# Patient Record
Sex: Male | Born: 1960 | Race: White | Marital: Married
Health system: Southern US, Community
[De-identification: ages and names within clinical notes are randomized; demographics above are authoritative.]

## PROBLEM LIST (undated history)

## (undated) DIAGNOSIS — I341 Nonrheumatic mitral (valve) prolapse: Secondary | ICD-10-CM

## (undated) DIAGNOSIS — M109 Gout, unspecified: Secondary | ICD-10-CM

## (undated) DIAGNOSIS — M1990 Unspecified osteoarthritis, unspecified site: Secondary | ICD-10-CM

## (undated) DIAGNOSIS — I499 Cardiac arrhythmia, unspecified: Secondary | ICD-10-CM

## (undated) DIAGNOSIS — M542 Cervicalgia: Secondary | ICD-10-CM

## (undated) DIAGNOSIS — I10 Essential (primary) hypertension: Secondary | ICD-10-CM

## (undated) HISTORY — DX: Nonrheumatic mitral (valve) prolapse: I34.1

## (undated) HISTORY — DX: Essential (primary) hypertension: I10

## (undated) HISTORY — PX: HX BACK SURGERY: SHX140

## (undated) HISTORY — DX: Gout, unspecified: M10.9

## (undated) HISTORY — DX: Unspecified osteoarthritis, unspecified site: M19.90

## (undated) HISTORY — DX: Cervicalgia: M54.2

---

## 1999-11-10 HISTORY — PX: HX BACK SURGERY: SHX140

## 2018-09-21 ENCOUNTER — Other Ambulatory Visit: Payer: Self-pay | Admitting: Orthopaedic Surgery

## 2018-09-21 DIAGNOSIS — M545 Low back pain, unspecified: Secondary | ICD-10-CM

## 2018-09-22 ENCOUNTER — Other Ambulatory Visit: Payer: Self-pay | Admitting: Orthopaedic Surgery

## 2018-09-22 DIAGNOSIS — Z77018 Contact with and (suspected) exposure to other hazardous metals: Secondary | ICD-10-CM

## 2018-09-26 ENCOUNTER — Ambulatory Visit
Admission: RE | Admit: 2018-09-26 | Discharge: 2018-09-26 | Disposition: A | Discharge status: Home / Self Care | Payer: Medicare Other | Source: Ambulatory Visit | Attending: Orthopaedic Surgery | Admitting: Orthopaedic Surgery

## 2018-09-26 DIAGNOSIS — Z77018 Contact with and (suspected) exposure to other hazardous metals: Secondary | ICD-10-CM

## 2018-10-10 ENCOUNTER — Ambulatory Visit
Admission: RE | Admit: 2018-10-10 | Discharge: 2018-10-10 | Disposition: A | Discharge status: Home / Self Care | Payer: Medicare Other | Source: Ambulatory Visit | Attending: Orthopaedic Surgery | Admitting: Orthopaedic Surgery

## 2018-10-10 DIAGNOSIS — M545 Low back pain, unspecified: Secondary | ICD-10-CM

## 2020-02-05 ENCOUNTER — Ambulatory Visit: Payer: Medicare Other | Attending: Internal Medicine

## 2020-02-05 DIAGNOSIS — Z23 Encounter for immunization: Secondary | ICD-10-CM

## 2020-02-05 NOTE — Progress Notes (Signed)
   Covid-19 Vaccination Clinic  Name:  Jeffrey Benjamin    MRN: [id number] DOB: 02/05/1961  02/05/2020  Mr. [name] was observed post Covid-19 immunization for 15 minutes without incident. He was provided with Vaccine Information Sheet and instruction to access the V-Safe system.   Mr. [name] was instructed to call 911 with any severe reactions post vaccine: Marland Kitchen Difficulty breathing  . Swelling of face and throat  . A fast heartbeat  . A bad rash all over body  . Dizziness and weakness   Immunizations Administered    Name Date Dose VIS Date Route   Pfizer COVID-19 Vaccine 02/05/2020  9:41 AM 0.3 mL 10/20/2019 Intramuscular   Manufacturer: [employer], [employer]   Lot: [id number]   NDC: [address]-9

## 2020-02-28 ENCOUNTER — Ambulatory Visit: Payer: Medicare Other | Attending: Internal Medicine

## 2020-02-28 DIAGNOSIS — Z23 Encounter for immunization: Secondary | ICD-10-CM

## 2020-02-28 NOTE — Progress Notes (Signed)
   Covid-19 Vaccination Clinic  Name:  Jeffrey Benjamin    MRN: [id number] DOB: 20-Mar-1961  02/28/2020  Jeffrey Benjamin was observed post Covid-19 immunization for 15 minutes without incident. He was provided with Vaccine Information Sheet and instruction to access the V-Safe system.   Jeffrey Benjamin was instructed to call 911 with any severe reactions post vaccine: Marland Kitchen Difficulty breathing  . Swelling of face and throat  . A fast heartbeat  . A bad rash all over body  . Dizziness and weakness   Immunizations Administered    Name Date Dose VIS Date Route   Pfizer COVID-19 Vaccine 02/28/2020  9:15 AM 0.3 mL 01/03/2019 Intramuscular   Manufacturer: [employer], [employer]   Lot: [id number]   NDC: [address]-9

## 2020-09-22 IMAGING — MR MR LUMBAR SPINE W/O CM
4 of 5 series · 25 of 48 positions shown · non-contrast
Comparison: None.

CLINICAL DATA: Low back pain

EXAM:
MRI LUMBAR SPINE WITHOUT CONTRAST
TECHNIQUE: Multiplanar, multisequence MR imaging of the lumbar spine was
performed. No intravenous contrast was administered.

[Series 5: T2 · sagittal · 4.0mm · 0.55mm/px · 6 of 15 slices shown (1 of 2)]
[im 1/15]
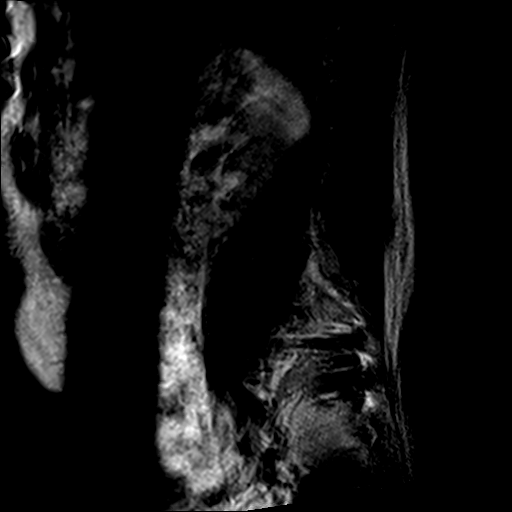
[im 3/15]
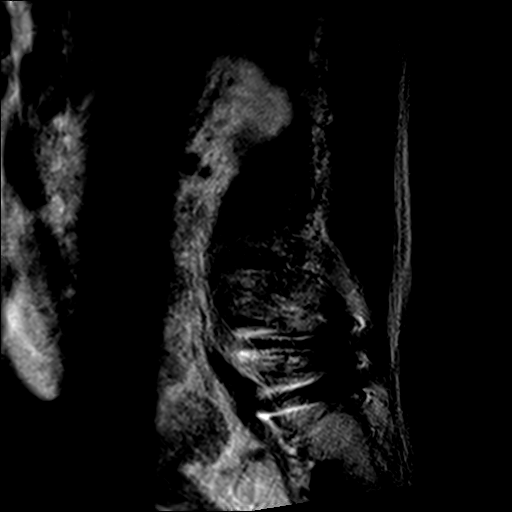
[im 6/15]
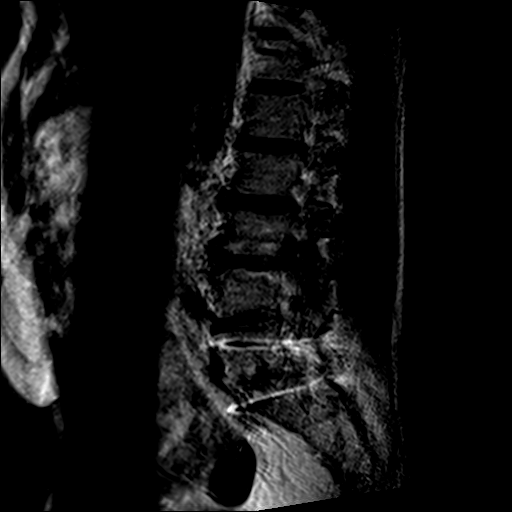
[im 9/15]
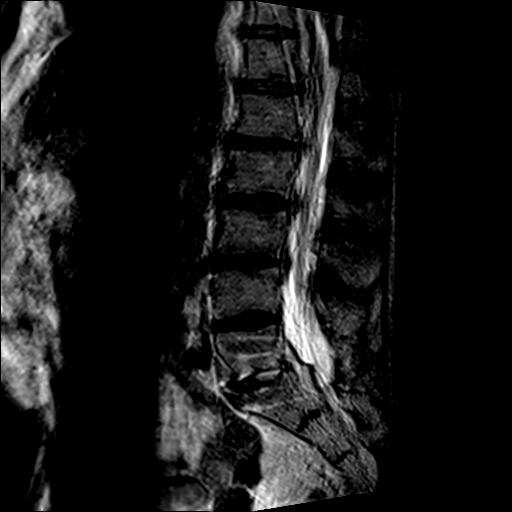
[im 12/15]
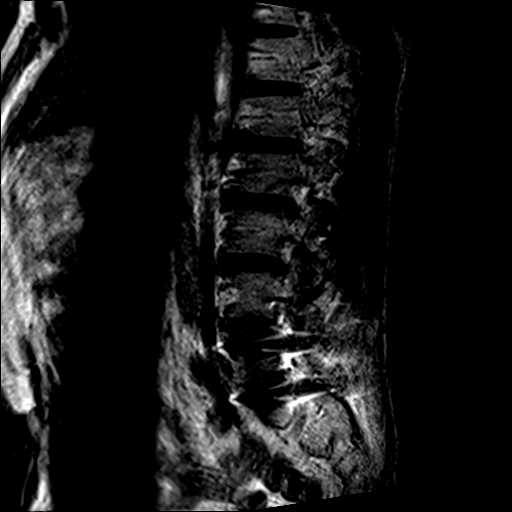
[im 15/15]
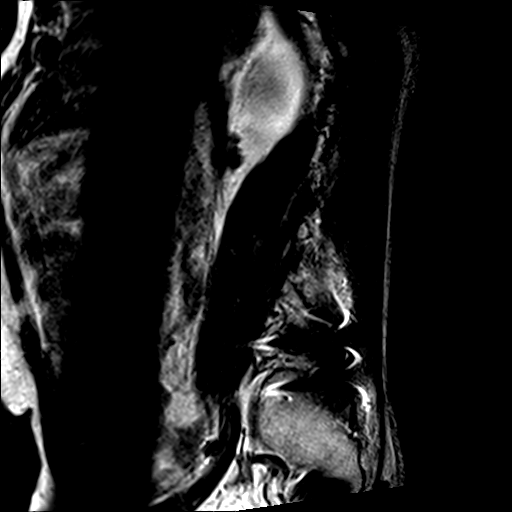

[Series 6: T1 · sagittal · 4.0mm · 0.55mm/px · 6 of 15 slices shown (1 of 2)]
[im 1/15]
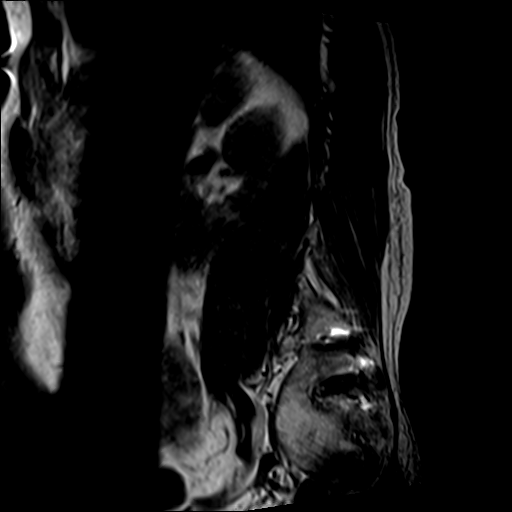
[im 3/15]
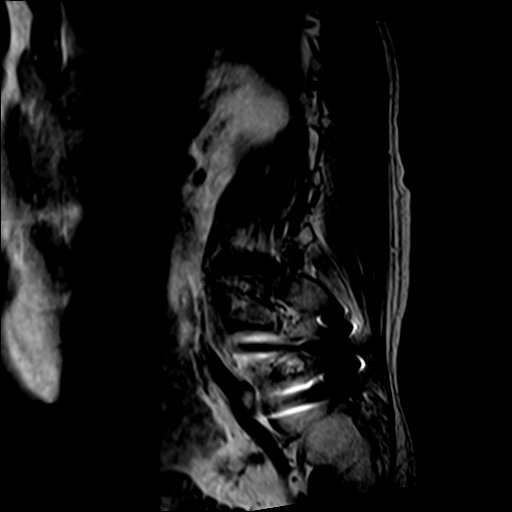
[im 6/15]
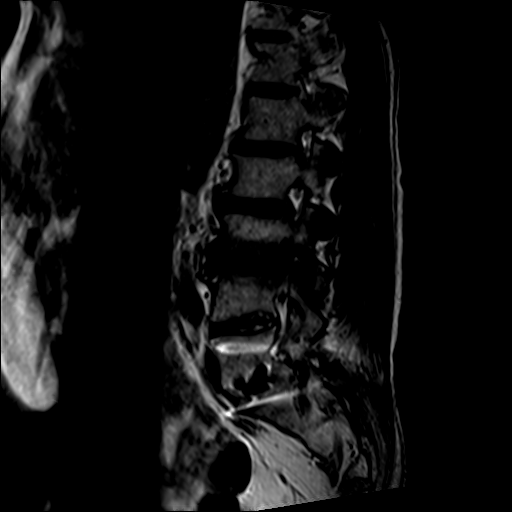
[im 9/15]
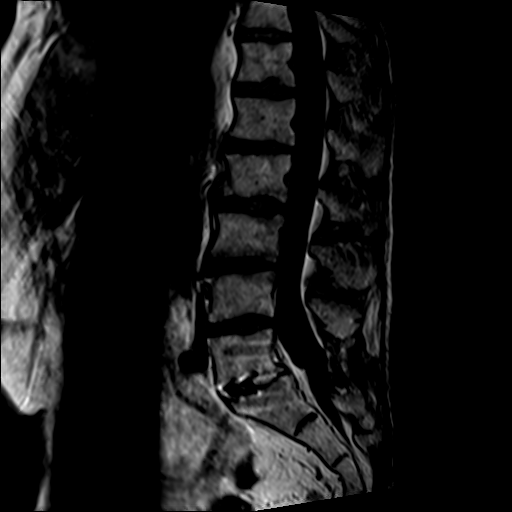
[im 12/15]
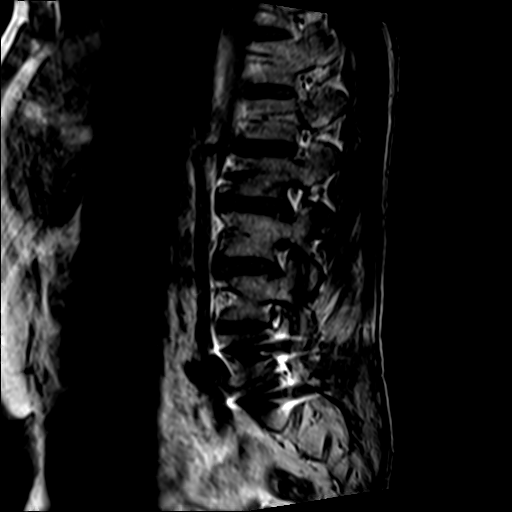
[im 15/15]
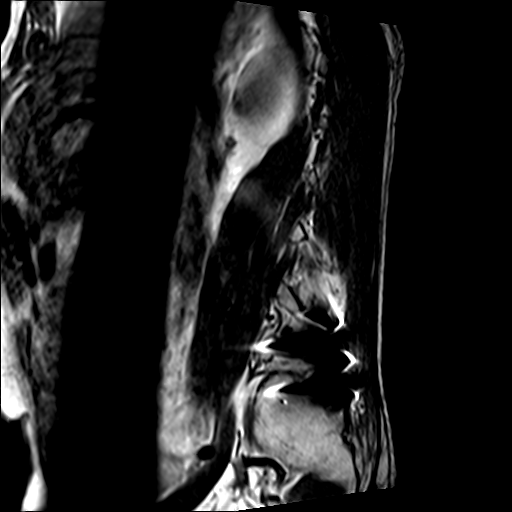

[Series 8: T1 · axial · 4.0mm · 0.39mm/px · z∈[-88,+90]mm · 4 of 38 slices shown (2 of 2)]
[im 1/38]
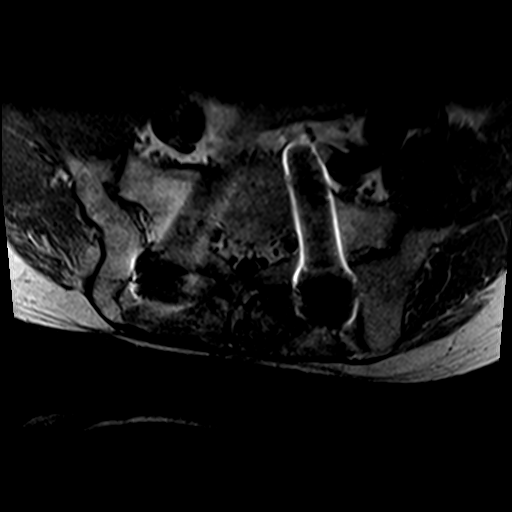
[im 6/38]
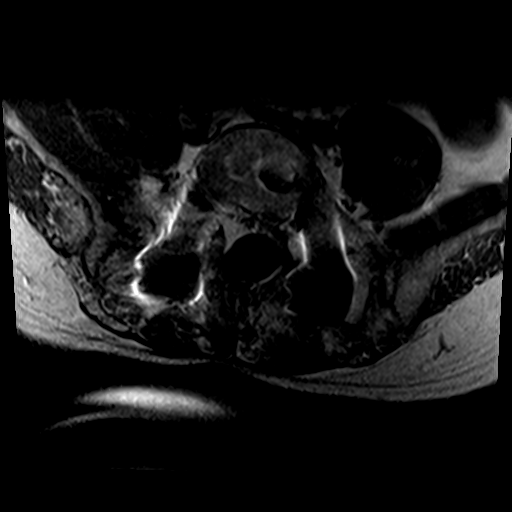
[im 19/38]
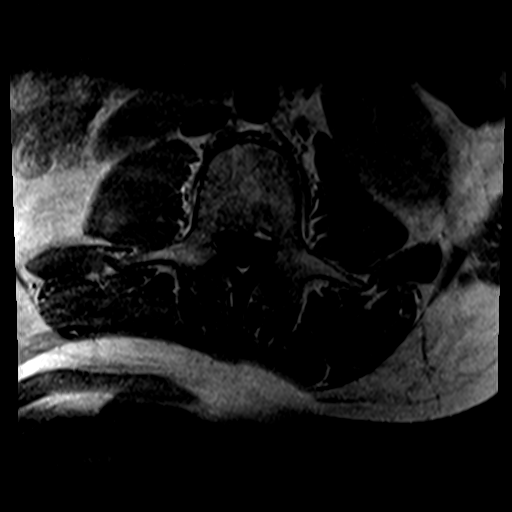
[im 32/38]
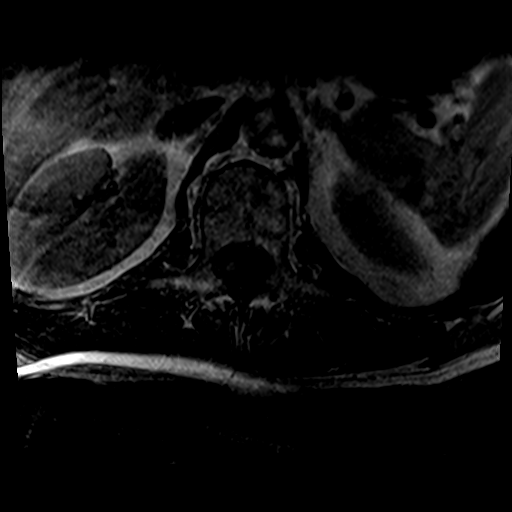

[Series 9: T2 · axial · 4.0mm · 0.78mm/px · z∈[-88,+120]mm · 9 of 38 slices shown (2 of 2)]
[im 1/38]
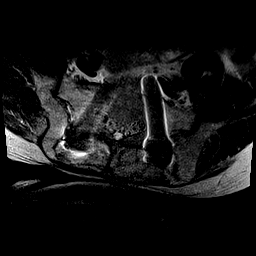
[im 6/38]
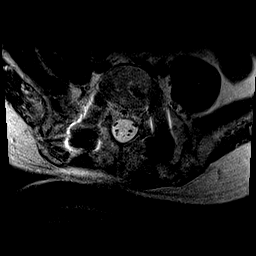
[im 11/38]
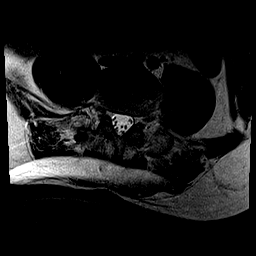
[im 16/38]
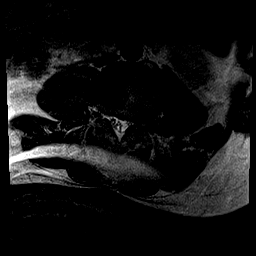
[im 19/38]
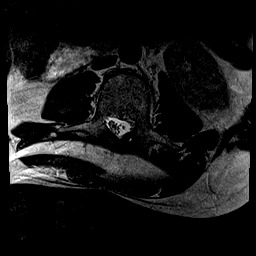
[im 22/38]
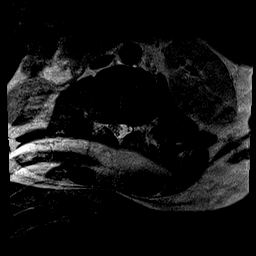
[im 27/38]
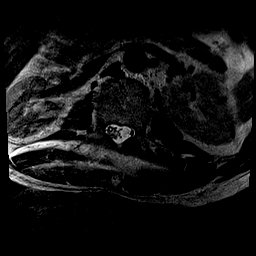
[im 32/38]
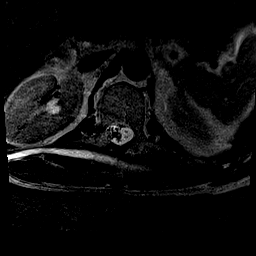
[im 38/38]
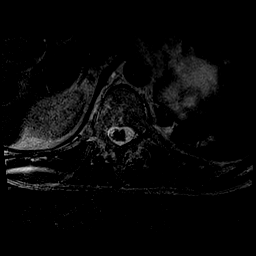

[25 of 48 positions shown; findings below may reference images not displayed]

FINDINGS: Segmentation:  Presumably standard based on the available coverage

Alignment:  Fused anterolisthesis at L5-S1 that is mild.

Vertebrae: No fracture, evidence of discitis, or bone lesion. There
is degenerative type endplate edematous signal about the L3-4 disc
space towards the left.

Conus medullaris and cauda equina: Conus extends to the L1 level.
The conus has a normal appearance. Cauda equina is preferentially
accumulated in the right canal, likely positional.

Paraspinal and other soft tissues: Negative

Disc levels:

T12- L1: Unremarkable.

L1-L2: Narrowing of the disc mild bulge.  No impingement

L2-L3: Narrowing of the disc with bulging.  No impingement

L3-L4: Disc narrowing asymmetric to the left where there is a
foraminal extrusion severely compressing the L3 nerve root. The
canal and right foramina are patent

L4-L5: Question posterior-lateral fusion/arthrodesis. The disc is
narrow. No impingement

L5-S1:PLIF with solid arthrodesis and no impingement
IMPRESSION: 1. L3-4 left foraminal extrusion with L3 compression.
2. L5-S1 PLIF with solid arthrodesis and no residual impingement.
There is likely also posterior arthrodesis or ankylosis at L4-5.

## 2021-11-20 ENCOUNTER — Other Ambulatory Visit (HOSPITAL_COMMUNITY): Payer: Self-pay | Admitting: INTERNAL MEDICINE

## 2021-11-20 DIAGNOSIS — R011 Cardiac murmur, unspecified: Secondary | ICD-10-CM

## 2021-12-09 ENCOUNTER — Ambulatory Visit (HOSPITAL_BASED_OUTPATIENT_CLINIC_OR_DEPARTMENT_OTHER): Payer: Medicare Other | Admitting: Cardiovascular Disease

## 2021-12-26 ENCOUNTER — Encounter (HOSPITAL_BASED_OUTPATIENT_CLINIC_OR_DEPARTMENT_OTHER): Payer: Self-pay

## 2022-01-01 ENCOUNTER — Ambulatory Visit (HOSPITAL_BASED_OUTPATIENT_CLINIC_OR_DEPARTMENT_OTHER): Payer: Medicare Other | Admitting: Cardiovascular Disease

## 2022-01-21 ENCOUNTER — Ambulatory Visit (INDEPENDENT_AMBULATORY_CARE_PROVIDER_SITE_OTHER): Payer: Medicare Other | Admitting: Interventional Cardiology

## 2022-01-21 ENCOUNTER — Encounter (HOSPITAL_BASED_OUTPATIENT_CLINIC_OR_DEPARTMENT_OTHER): Payer: Self-pay | Admitting: Interventional Cardiology

## 2022-01-21 ENCOUNTER — Other Ambulatory Visit: Payer: Self-pay

## 2022-01-21 VITALS — BP 160/100 | HR 67 | Ht 65.0 in | Wt 135.2 lb

## 2022-01-21 DIAGNOSIS — R011 Cardiac murmur, unspecified: Secondary | ICD-10-CM

## 2022-01-21 DIAGNOSIS — E785 Hyperlipidemia, unspecified: Secondary | ICD-10-CM

## 2022-01-21 DIAGNOSIS — I341 Nonrheumatic mitral (valve) prolapse: Secondary | ICD-10-CM

## 2022-01-21 DIAGNOSIS — I10 Essential (primary) hypertension: Secondary | ICD-10-CM

## 2022-01-21 NOTE — H&P (Signed)
Cardiology Consult Note  [facility]  [address]  [place] [address]  [phone]      Name: Caleb Knight                       Date of Birth: 05-31-1961   MRN:  [id number]                         Date of visit: 01/21/2022     PCP: Alen Blew, MD     Reason for Consult:     HOPI:        Caleb Knight is a  61 y.o. year old male who  has a history of valvular heart disease with mitral valve prolapse and used to follow up with a cardiologist at [facility] [place], [place].  He recently saw his PCP and requested establishing with a local cardiologist and hence was referred to [place].     he had an echo in 2021 which showed normal left ventricular systolic function, no wall motion abnormalities, borderline mitral valve prolapse with trace MR, borderline LVH.  He had a negative Lexiscan Cardiolite stress test in 2018 for unspecified       His cardiovascular risk factors are significant for hypertension, hyperlipidemia.   No previous history of smoking diabetes or family history premature CAD.      Most recent lab work showed normal hemoglobin, normal  Potassium, LDL 170, HDL 50, normal TSH.   He was offered statin therapy which he declined and would like to use lifestyle changes.      Today, heart rate  Is well controlled, blood pressure is mildly elevated.  He denies any chest pain nausea vomiting, dizziness, shortness of breath, edema or palpitations.    Past Medical History:   Diagnosis Date   . Hypertension            Past Surgical History:   Procedure Laterality Date   . HX BACK SURGERY             Current Outpatient Medications   Medication Sig   . amitriptyline (ELAVIL) 50 mg Oral Tablet Take 1 Tablet (50 mg total) by mouth Every night   . atenoloL (TENORMIN) 25 mg Oral Tablet    . meloxicam (MOBIC) 15 mg Oral Tablet    . traZODone (DESYREL) 100 mg Oral Tablet Take 1 Tablet (100 mg total) by mouth Every night       Allergies   Allergen Reactions   . Latex,  Freight forwarder        Family Medical History:     Problem Relation (Age of Onset)    COPD Brother    Cancer Brother    Diabetes Brother, Daughter    Elevated Lipids Daughter    Hearing Loss Mother    Heart Attack Father    Heart Disease Mother, Father, Sister    Hypertension (High Blood Pressure) Mother, Father            Social History     Tobacco Use   . Smoking status: Never   . Smokeless tobacco: Former     Types: Chew   Substance Use Topics   . Alcohol use: Not on file        REVIEW OF SYSTEMS:  Constitutional: No fevers, chills, malaise or abnormal weight loss.   HEENT: No issues  Respiratory: No cough, shortness of breath  Cardiovascular:  No chest painpalpitations, lower extremity edema.    Gastrointestinal: No abdominal pain, nausea, vomiting  Musculoskeletal: No weakness   Hematological: No issues  Genitourinary: No complaints  Skin: No rashes.  Neurological: Noweakness or loss of sensation  All other systems reviewed and are negative except as noted in HOPI.    PHYSICAL EXAMINATION:     Vitals reviewed. There were no vitals taken for this visit.      Vitals Filed    No data found in the last 1 encounters.         Constitutional: He is oriented to person, place, and time.  He appears well-developed and well-nourished.   HEENT:  Normocephalic, atraumatic.    Neck: Supple. Thyroid normal without enlargement.  Trachea midline.  Cardiovascular: Normal rate and rhythm without murmurs, rubs or gallops.  No JVD.  No bruits.  No peripheral edema is noted.  Pulmonary/Chest: Lungs clear without wheezes, rales or rhonchi.    Abdominal: Soft. Nontender, nondistended ,no organomegaly.  Musculoskeletal: No weakness.  Neurological: Alert and oriented x 4.  Cranial nerves 2-12 intact without defecits.  BUE's/BLE's.  Strength and sensation intact in all extremeties.   Skin: Skin is warm and dry.   Psychiatric:  Normal mood and affect. Speech is normal and behavior is normal. Judgment and thought content normal.   Vascular:  Peripheral  pulses well felt.  Labs:     No results found for this or any previous visit (from the past 24 hour(s)).    Labs:   No results found for: HA1C, MICALBCREAT, LDLCHOL, TRIG, HDLCHOL, CHOLESTEROL No results found for: SODIUM, POTASSIUM, CHLORIDE, CO2, BUN, CREATININE, GFR, CALCIUM      Imaging:    Assessment:    1. Murmur, cardiac    2. Benign hypertension    3. Hyperlipidemia LDL goal <70    4. Cardiac murmur    5. Mitral valve prolapse         Plan:  Patient is stable from a cardiovascular standpoint.  No symptoms of angina, no symptoms or signs of CHF  Blood pressure  is elevatedand heart rate is well controlled.   recommended to keep a log of blood pressure for a week and call me and if it is consistently greater than 130/80, his medication needs to be adjusted.   I reviewed his lab work with him especially is LDL of 170.   He would like to managed with lifestyle changes.  Encouraged healthy diet and daily exercise.  Recommended to be treated with medications,  Reassess in 3 months   Follow-up in 1 year or sooner if needed    No orders of the defined types were placed in this encounter.        Thank you very much for involving me in the care of this patient.  Please feel free to call me if I can be of any further assistance.    Electronically signed by     Aldona Bar, MD  01/21/2022, 10:14      This note may have been partially generated using MModal Fluency Direct system, and there may be some incorrect words, spellings, and punctuation that were not noted in checking the note before saving, though effort was made to avoid such errors.

## 2022-10-28 ENCOUNTER — Ambulatory Visit: Payer: Medicare Other

## 2022-10-30 ENCOUNTER — Telehealth (INDEPENDENT_AMBULATORY_CARE_PROVIDER_SITE_OTHER): Payer: Self-pay | Admitting: ORTHOPEDIC SURGERY

## 2022-10-30 NOTE — Telephone Encounter (Signed)
This documentation is for an intake review.    Documentation presented in the intake form suggested the patient has neck pain as well as bilateral shoulder pain in low back pain.  Other portions of the forearm indicate the patient has bilateral shoulder pain.  There is also a note of bilateral hand numbness in left lower extremity weakness, which has been present for 6 months.  The patient describes some difficulty with buttoning clothes and holding onto utensils.  He did have an epidural steroid injection performed in [place] in the lumbar region, which lasted 3-5 weeks.  This formed in January of 2021.  He did take a Medrol Dosepak in January of 2020 with no improvement in his symptoms.  He also has a history of previous lumbar fusion.  Based on the symptoms, which seem to be myelopathic, I would recommend an MRI series of the cervical spine.  Once we have this, I can re-review its to determine if he would be a reasonable candidate to meet with    Cervical x-ray, 10/28/2022.  There is evidence of multi level spondylotic changes with preservation of cervical lordosis.  There is no evidence of any anterolisthesis, but there is very slight retrolisthesis of C4-5.  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  There was no advanced imaging to review.

## 2022-11-12 ENCOUNTER — Other Ambulatory Visit (HOSPITAL_COMMUNITY): Payer: Self-pay | Admitting: Family

## 2022-11-12 DIAGNOSIS — M542 Cervicalgia: Secondary | ICD-10-CM

## 2022-11-12 DIAGNOSIS — G9009 Other idiopathic peripheral autonomic neuropathy: Secondary | ICD-10-CM

## 2022-11-19 ENCOUNTER — Other Ambulatory Visit: Payer: Self-pay

## 2022-11-19 ENCOUNTER — Inpatient Hospital Stay
Admission: RE | Admit: 2022-11-19 | Discharge: 2022-11-19 | Disposition: A | Discharge status: Home / Self Care | Payer: Medicare Other | Source: Ambulatory Visit | Attending: Family | Admitting: Family

## 2022-11-19 DIAGNOSIS — G9009 Other idiopathic peripheral autonomic neuropathy: Secondary | ICD-10-CM | POA: Insufficient documentation

## 2022-11-19 DIAGNOSIS — M542 Cervicalgia: Secondary | ICD-10-CM | POA: Insufficient documentation

## 2022-12-04 ENCOUNTER — Other Ambulatory Visit: Payer: Self-pay

## 2022-12-04 ENCOUNTER — Encounter (INDEPENDENT_AMBULATORY_CARE_PROVIDER_SITE_OTHER): Payer: Self-pay | Admitting: ORTHOPEDIC SURGERY

## 2022-12-04 ENCOUNTER — Other Ambulatory Visit (INDEPENDENT_AMBULATORY_CARE_PROVIDER_SITE_OTHER): Payer: Medicare Other

## 2022-12-04 ENCOUNTER — Ambulatory Visit: Payer: Medicare Other | Attending: ORTHOPEDIC SURGERY | Admitting: ORTHOPEDIC SURGERY

## 2022-12-04 VITALS — BP 130/72 | HR 64 | Temp 98.8°F | Ht 65.0 in | Wt 136.7 lb

## 2022-12-04 DIAGNOSIS — M542 Cervicalgia: Secondary | ICD-10-CM

## 2022-12-04 DIAGNOSIS — G959 Disease of spinal cord, unspecified: Secondary | ICD-10-CM | POA: Insufficient documentation

## 2022-12-04 NOTE — Progress Notes (Signed)
[facility]  DEPARTMENT OF ORTHOPAEDICS    History and Physical    Patient: Caleb Knight  DOB: 1961/10/15  MRN: [id number]    Date: 12/04/2022    CHIEF COMPLAINT:  Chief Complaint   Patient presents with    Neck Pain    Shoulder Pain        HISTORY OF PRESENT ILLNESS:  Finian Helvey is a 62 y.o. male who presents today for evaluation of neck pain, gait imbalance, bilateral hand numbness/tingling.  He has been dealing with this for several years now.  However, over the past year it has gotten much worse.  He states that often times he does not know where his left leg is in space.  He also has noticed significant weakness in his grip strength to the point where he has to compensate with this proximal upper extremity musculature.  He also complains of persistent bilateral hand numbness/tingling.  He also has significant neck pain that limits his ability to get good rest.  He has a remote history of a lower lumbar fracture that required surgery.  This happened in 2001 and he has been on disability since.  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  He has not done physical therapy nor received injections.  He does take gabapentin and provides him with some relief.  He states that over this last year all of the aforementioned symptoms have worsened over time and he presents today try to find a solution to these problems.  He notices difficulty buttoning buttons on his shirt, picking up coins from his wallet, and his handwriting has gotten worse.  He states he has some left lower extremity weakness that was the result from his injury back in 2001.      Past Medical History:   Diagnosis Date    Hypertension        Past Surgical History:   Procedure Laterality Date    HX BACK SURGERY       Family Medical History:       Problem Relation (Age of Onset)    COPD Brother    Cancer Brother    Diabetes Brother, Daughter    Elevated Lipids Daughter    Hearing Loss  Mother    Heart Attack Father    Heart Disease Mother, Father, Sister    Hypertension (High Blood Pressure) Mother, Father              Current Outpatient Medications:     amitriptyline (ELAVIL) 50 mg Oral Tablet, Take 1 Tablet (50 mg total) by mouth Every night, Disp: , Rfl:     atenoloL (TENORMIN) 25 mg Oral Tablet, , Disp: , Rfl:     gabapentin (NEURONTIN) 100 mg Oral Capsule, Take 1 Capsule (100 mg total) by mouth Three times a day, Disp: , Rfl:     meloxicam (MOBIC) 15 mg Oral Tablet, , Disp: , Rfl:     traZODone (DESYREL) 100 mg Oral Tablet, Take 1 Tablet (100 mg total) by mouth Every night, Disp: , Rfl:      Allergies   Allergen Reactions    Latex, Natural Rubber         REVIEW OF SYSTEMS:  Review of systems negative except as stated in HPI.    PHYSICAL EXAM:  BP 130/72   Pulse 64   Temp 37.1 C (98.8 F)   Ht 1.651 m (5\' 5" )   Wt 62 kg (  136 lb 11 oz)   BMI 22.75 kg/m     Gen - the patient is alert oriented x3 in no apparent distress   Standing position - patient stands with head and shoulders well balanced above pelvis  Range of motion - near full in flexion/extension and rotation.  Tenderness to palpation - None  Strength - the patient has 5/5 strength in the bilateral upper and lower extremities.  Sensation - the patient has sensation intact to light touch in the C5-T1 and L2-S1 nerve distribution bilaterally but diminished in the C6 and C7 distributions bilaterally.  Reflexes - Biceps +2 , Triceps +2 , Brachioradialis +2 , Patellar +2 , Achilles +2  Gait - normal , Tandem Gait - normal  Provocative Tests - Inverted radial reflex - not present , Hoffman's - not present , Clonus - no beats , Spurling's - negative , Lehrmitte - negative    IMAGING:  I independently reviewed all imaging and my interpretation is as follows:    XR of the cervical spine were obtained today.  This includes AP and lateral which demonstrate multilevel spondylotic changes.  Significant degenerative changes throughout the  subaxial cervical spine worse at the C3-C4, C4-C5, C5-C6 levels.  He has maintenance of his overall cervical lordosis and there is evidence of a grade 1 C4 on C5 retrolisthesis.    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    ASSESSMENT/PLAN:  62 y.o. male with     ICD-10-CM    1. Neck pain  M54.2 XR CERVICAL SPINE AP AND LATERAL      2. Cervical myelopathy (CMS HCC)  G95.9          We discussed the patient's clinical and radiological findings in detail.  We discussed the findings of cervical myelopathy and its typical presentation along with its prognosis.  We made him aware that this is a progressive condition that will worsen over time eventually leading to permanent loss of function and disability.  We discussed would the different surgical options available for this including multilevel laminoplasty versus laminectomy and fusion.  We let him know that the laminoplasty has a quicker recovery however he does have a component of significant neck pain and that a laminectomy and fusion would likely help alleviate the neck pain more so than the laminoplasty would.  We would let him know that he should go home with his wife and think about all of the information that was obtained today and we would talk to him in 1-2 weeks to see what decision he has come to. All the patient's questions were answered today and they were in full agreement with the plan.      Lucas Mallow, MD   Spine Fellow    I discussed the exam and findings with the fellow. I independently met with and examined the patient. Agree with plan as documented.     Mariella Saa, MD

## 2022-12-06 DIAGNOSIS — M4312 Spondylolisthesis, cervical region: Secondary | ICD-10-CM

## 2022-12-06 DIAGNOSIS — M5032 Other cervical disc degeneration, mid-cervical region, unspecified level: Secondary | ICD-10-CM

## 2022-12-06 DIAGNOSIS — M5033 Other cervical disc degeneration, cervicothoracic region: Secondary | ICD-10-CM

## 2022-12-21 ENCOUNTER — Other Ambulatory Visit (HOSPITAL_BASED_OUTPATIENT_CLINIC_OR_DEPARTMENT_OTHER): Payer: Self-pay | Admitting: Interventional Cardiology

## 2022-12-21 MED ORDER — ATENOLOL 25 MG TABLET
25.0000 mg | ORAL_TABLET | Freq: Every day | ORAL | 3 refills | Status: DC
Start: 2022-12-21 — End: 2024-06-14

## 2022-12-31 ENCOUNTER — Encounter (INDEPENDENT_AMBULATORY_CARE_PROVIDER_SITE_OTHER): Payer: Self-pay | Admitting: ORTHOPEDIC SURGERY

## 2023-01-20 ENCOUNTER — Inpatient Hospital Stay (HOSPITAL_BASED_OUTPATIENT_CLINIC_OR_DEPARTMENT_OTHER)
Admission: RE | Admit: 2023-01-20 | Discharge: 2023-01-20 | Disposition: A | Discharge status: Home / Self Care | Payer: Medicare Other | Source: Ambulatory Visit

## 2023-01-20 ENCOUNTER — Encounter (HOSPITAL_BASED_OUTPATIENT_CLINIC_OR_DEPARTMENT_OTHER): Payer: Self-pay | Admitting: Internal Medicine

## 2023-01-20 ENCOUNTER — Ambulatory Visit: Payer: Medicare Other | Attending: Internal Medicine | Admitting: Internal Medicine

## 2023-01-20 ENCOUNTER — Other Ambulatory Visit: Payer: Self-pay

## 2023-01-20 ENCOUNTER — Other Ambulatory Visit (HOSPITAL_BASED_OUTPATIENT_CLINIC_OR_DEPARTMENT_OTHER): Payer: Medicare Other

## 2023-01-20 VITALS — BP 138/80 | HR 70 | Temp 96.8°F | Ht 64.96 in | Wt 132.3 lb

## 2023-01-20 DIAGNOSIS — Z01818 Encounter for other preprocedural examination: Secondary | ICD-10-CM

## 2023-01-20 DIAGNOSIS — M810 Age-related osteoporosis without current pathological fracture: Secondary | ICD-10-CM

## 2023-01-20 DIAGNOSIS — M79609 Pain in unspecified limb: Secondary | ICD-10-CM

## 2023-01-20 LAB — COMPREHENSIVE METABOLIC PANEL, NON-FASTING
ALBUMIN: 4.3 g/dL (ref 3.4–4.8)
ALKALINE PHOSPHATASE: 89 U/L (ref 45–115)
ALT (SGPT): 20 U/L (ref 10–55)
ANION GAP: 9 mmol/L (ref 4–13)
AST (SGOT): 42 U/L (ref 8–45)
BILIRUBIN TOTAL: 0.4 mg/dL (ref 0.3–1.3)
BUN/CREA RATIO: 23 — ABNORMAL HIGH (ref 6–22)
BUN: 18 mg/dL (ref 8–25)
CALCIUM: 10.5 mg/dL — ABNORMAL HIGH (ref 8.6–10.3)
CHLORIDE: 105 mmol/L (ref 96–111)
CO2 TOTAL: 25 mmol/L (ref 23–31)
CREATININE: 0.79 mg/dL (ref 0.75–1.35)
ESTIMATED GFR - MALE: >90 mL/min/BSA (ref >=60)
GLUCOSE: 132 mg/dL — ABNORMAL HIGH (ref 65–125)
POTASSIUM: 4.7 mmol/L (ref 3.5–5.1)
PROTEIN TOTAL: 7.4 g/dL (ref 6.0–8.0)
SODIUM: 139 mmol/L (ref 136–145)

## 2023-01-20 LAB — CBC WITH DIFF
BASOPHIL #: <0.1 10*3/uL (ref <=0.20)
BASOPHIL %: 0.7 %
EOSINOPHIL #: 0.12 10*3/uL (ref <=0.50)
EOSINOPHIL %: 1 %
HCT: 40.1 % (ref 38.9–52.0)
HGB: 13.1 g/dL — ABNORMAL LOW (ref 13.4–17.5)
IMMATURE GRANULOCYTE #: <0.1 10*3/uL (ref <0.10)
IMMATURE GRANULOCYTE %: 0.3 % (ref 0.0–1.0)
LYMPHOCYTE #: 1.83 10*3/uL (ref 1.00–4.80)
LYMPHOCYTE %: 15.8 %
MCH: 27.9 pg (ref 26.0–32.0)
MCHC: 32.7 g/dL (ref 31.0–35.5)
MCV: 85.5 fL (ref 78.0–100.0)
MONOCYTE #: 0.7 10*3/uL (ref 0.20–1.10)
MONOCYTE %: 6.1 %
MPV: 12.7 fL — ABNORMAL HIGH (ref 8.7–12.5)
NEUTROPHIL #: 8.8 10*3/uL — ABNORMAL HIGH (ref 1.50–7.70)
NEUTROPHIL %: 76.1 %
PLATELETS: 224 10*3/uL (ref 150–400)
RBC: 4.69 10*6/uL (ref 4.50–6.10)
RDW-CV: 13.7 % (ref 11.5–15.5)
WBC: 11.6 10*3/uL — ABNORMAL HIGH (ref 3.7–11.0)

## 2023-01-20 LAB — PT/INR
INR: 0.96 (ref 0.80–1.20)
PROTHROMBIN TIME: 11.7 seconds (ref 9.8–14.4)

## 2023-01-20 NOTE — H&P (Signed)
[facility]  Consult       Garris, Besch, 62 y.o. male Surgeon:  Alfonso Patten, MD   Date of Birth:  [birth year] Date of Service:  01/20/2023    Medical Record Number: [id number] Preferred pharmacy:       Problem List Items Addressed This Visit    None  Visit Diagnoses       Pain in extremity, unspecified extremity    -  Primary    Relevant Orders    PT/INR    Preop testing        Relevant Orders    CBC/DIFF    COMPREHENSIVE METABOLIC PANEL, NON-FASTING    PT/INR    ECG 12-LEAD (UTC ONLY)    VITAMIN D 25 TOTAL    Osteoporosis, unspecified osteoporosis type, unspecified pathological fracture presence        Relevant Orders    VITAMIN D 25 TOTAL           Neck pain  Cervical myelopathy  - Patient has failed conservative therapy, operative management is planned. C4-C7 decompression, C3-T1 fusion  - Pre-operative labs and EKG ordered, pending  - Patient is without risk factors for excessive bleeding or blood clots.  - Pain management as per surgical team. Continue conservative symptom relief until operative management.  -On gabapentin, elavil, meloxicam    History of Lspine surgery ([facility], 2001)  -records requested    Mitral valve prolapse  -Saw Cardiology 01/2022  -echo in 2021 which showed normal left ventricular systolic function, no wall motion abnormalities, borderline mitral valve prolapse with trace MR, borderline LVH. He had a negative Lexiscan Cardiolite stress test in 2018  -Continue atenolol 12.'5mg'$  daily    Insomnia  -Continue trazodone    Poor dentition  -has a broken tooth that needs removed    Takes fish oil for general health and arthritis.    Pre-operative examination  - Patient is at acceptable risk of perioperative complications for the planned moderate risk surgery pending lab results, EKG, and below clearances.   - Clearances pending: labs, EKG    Orders Placed This Encounter    CBC/DIFF    COMPREHENSIVE METABOLIC PANEL, NON-FASTING     PT/INR    VITAMIN D 25 TOTAL    ECG 12-LEAD (UTC ONLY)       HPI:  Caleb Knight is a 62 y.o. male that presents with need for pre-operative medical  optimization. He has a history of neck pain and cervical myelopathy requiring cervical decompression and fusion.     He says that he has been having neck pain and hand numbness and left leg numbness for quite some time worsening gradually but has been worse over last 2 years. He has difficulty sleeping at night.    Patient reports additional history of mitral valve prolapse.    The patient denies a history of MI, CVA, cancer, diabetes, pulmonary disorders like asthma or COPD, cardiac stents, heart arrhythmias (like a-fib), thyroid disease, GI problems, liver disease, renal problems (including CKD), anemia, or ever needing a blood transfusion.    Patient denies any fever, chills, dizziness, lightheadedness, chest pain, SOB, abdominal pain, nausea, vomiting, constipation, diarrhea, or trouble/pain with urination.    Patient denies chest pain, shortness of breath at rest, dyspnea on exertion, pre-syncope/syncope, palpitations, orthopnea, or PND  Patient denies previous DVT, family history of DVT, or sleep apnea.   Patient reports any ongoing dental issues. Needs a tooth pulled  Patient denies any previous problems with anesthesia.   Patient denies any family history of problems with anesthesia.    METS Metabolic Equivalents:  >4      History of MRSA?   No   Significant allergies to metal/nickel, latex, acrylic or iodine   Latex     A diagnosis of COVID-19 in the past 90 days?   No     Personal or family history of bleeding/clotting disorders:     No     History of a prosthetic joint infection/septic joint:      No     History of MI/CVA/cardiac stents/cardiac surgeries:      No     Pacemaker, ICD, or implanted device?     No     Last steroid injection in operative site:     NA         Social History     Tobacco Use    Smoking status: Never    Smokeless tobacco: Former      Types: Chew   Substance Use Topics    Alcohol use: Yes     Comment: occasional    Drug use: Not Currently       Current medications:  Outpatient Medications Marked as Taking for the 01/20/23 encounter (Office Visit) with Alver Sorrow, MD   Medication Sig    amitriptyline (ELAVIL) 50 mg Oral Tablet Take 1 Tablet (50 mg total) by mouth Every night    atenoloL (TENORMIN) 25 mg Oral Tablet Take 1 Tablet (25 mg total) by mouth Once a day (Patient taking differently: Take 0.5 Tablets (12.5 mg total) by mouth Once a day)    gabapentin (NEURONTIN) 100 mg Oral Capsule Take 1 Capsule (100 mg total) by mouth Three times a day    hydrOXYzine HCL (ATARAX) 25 mg Oral Tablet Take 1 Tablet (25 mg total) by mouth Twice per day as needed for Anxiety    meloxicam (MOBIC) 15 mg Oral Tablet Take 1 Tablet (15 mg total) by mouth Once a day    omega-3-DHA-EPA-fish oil (FISH OIL) 1,000 mg (120 mg-180 mg) Oral Capsule Take 1 Capsule by mouth Once a day    traZODone (DESYREL) 100 mg Oral Tablet Take 1 Tablet (100 mg total) by mouth Every night       Review of Systems   Constitutional:  Negative for activity change, appetite change, fatigue and unexpected weight change.   HENT:  Positive for dental problem. Negative for congestion, sinus pain, sore throat and trouble swallowing.    Eyes:  Negative for discharge and visual disturbance.   Respiratory:  Negative for cough, chest tightness, shortness of breath and wheezing.    Cardiovascular:  Negative for chest pain, palpitations and leg swelling.   Gastrointestinal:  Negative for abdominal pain, constipation, diarrhea, nausea and vomiting.   Genitourinary:  Negative for difficulty urinating, dysuria, frequency and urgency.   Musculoskeletal:  Positive for back pain. Negative for neck pain.   Skin:  Negative for rash and wound.   Neurological:  Positive for weakness and numbness. Negative for dizziness, syncope, light-headedness and headaches.   Hematological:  Does not bruise/bleed  easily.   Psychiatric/Behavioral:  Negative for behavioral problems.          Physical Exam  Vitals reviewed.   Constitutional:       General: He is not in acute distress.     Appearance: Normal appearance. He is not ill-appearing.   HENT:      Head:  Normocephalic and atraumatic.      Mouth/Throat:      Mouth: Mucous membranes are moist.      Comments: Broken upper left molar  Eyes:      Extraocular Movements: Extraocular movements intact.      Conjunctiva/sclera: Conjunctivae normal.   Neck:      Vascular: No carotid bruit.   Cardiovascular:      Rate and Rhythm: Normal rate and regular rhythm.      Pulses: Normal pulses.      Heart sounds: Normal heart sounds. No murmur heard.  Pulmonary:      Effort: Pulmonary effort is normal.      Breath sounds: Normal breath sounds. No wheezing or rhonchi.   Abdominal:      General: Bowel sounds are normal. There is no distension.      Palpations: Abdomen is soft.      Tenderness: There is no abdominal tenderness.   Musculoskeletal:         General: No swelling or tenderness.      Cervical back: Normal range of motion and neck supple.      Right lower leg: No edema.      Left lower leg: No edema.   Skin:     General: Skin is warm and dry.      Findings: No bruising or lesion.   Neurological:      General: No focal deficit present.      Mental Status: He is alert and oriented to person, place, and time.      Motor: No weakness.   Psychiatric:         Mood and Affect: Mood normal.         Behavior: Behavior normal.        Vitals:    01/20/23 1219   BP: 138/80   Pulse: 70   Temp: 36 C (96.8 F)   Weight: 60 kg (132 lb 4.4 oz)   Height: 1.65 m (5' 4.96")   BMI: 22.08           ASA 2 - Patient with mild systemic disease with no functional limitations  > 4 METs - Climbing a flight of stairs    [facility] Risk Evaluation  None  The risk of major cardiac complications varies according to the number of risk factors. If one includes only cardiac death, nonfatal MI, and nonfatal cardiac  arrest as major cardiac events in the [place], the following rates of adverse outcomes were seen:  No risk factors -- 0.4 percent (95% CI 0.1-0.8 percent)   One risk factor -- 1.0 percent (95% CI 0.5-1.4 percent)   Two risk factors -- 2.4 percent (95% CI 1.3-3.5 percent)   Three or more risk factors -- 5.4 percent (95% CI 2.8-7.9 percent)  *Perioperative cardiac events in patients undergoing noncardiac surgery: a review of the magnitude of the problem, the pathophysiology of the events and methods to estimate and communicate risk.Devereaux PJ, Merilyn Baba [place]. 2005;173(6):627.    Coding:   MDM  Reviewed: previous chart, nursing note and vitals  Reviewed previous: x-ray, ECG and labs  Interpretation: labs, ECG and x-ray  Consults: orthopedics       * I reviewed the documentation and progress notes from the above consults listed.     Labs reviewed:  A1C: Not Found  A1C Date: Not Found  COMPLETE BLOOD COUNT   No results found for: "WBC", "HGB", "HCT", "PLTCNT", "  BANDS"    DIFFERENTIAL  No results found for: "PMNS", "LYMPHOCYTES", "MYELOCYTES", "MONOCYTES", "EOSINOPHIL", "BASOPHILS", "NRBCS", "PMNABS", "LYMPHSABS", "EOSABS", "MONOSABS", "BASOSABS"  COMPREHENSIVE METABOLIC PANEL FASTING  No results found for: "SODIUM", "POTASSIUM", "CHLORIDE", "CO2", "ANIONGAP", "BUN", "CREATININE", "GLUCOSEFAST", "GLUCOSE", "CALCIUM", "PHOSPHORUS", "ALB", "ALBUMIN", "TOTALPROTEIN", "ALKPHOS", "AST", "ALT", "BILIRUBINCON"  PROTHROMBIN TIME and INR  No results found for: "PROTHROMTME", "INR"  VITAMIN D 25-HYDROXY  No results found for: "VITD25", "VITD"    XR Cspine 12/04/22  IMPRESSION:  Redemonstrated degenerative changes of the cervical spine, without acute bony abnormality.    Fransisco Hertz, MD, MPH  01/20/2023, 09:09  [facility]  Dept of Orthopaedics

## 2023-01-21 DIAGNOSIS — Z01818 Encounter for other preprocedural examination: Secondary | ICD-10-CM

## 2023-01-21 LAB — ECG 12-LEAD (UTC ONLY)
Atrial Rate: 53 {beats}/min
Calculated P Axis: 19 degrees
Calculated R Axis: 59 degrees
Calculated T Axis: 59 degrees
PR Interval: 156 ms
QRS Duration: 78 ms
QT Interval: 422 ms
QTC Calculation: 395 ms
Ventricular rate: 53 {beats}/min

## 2023-01-21 LAB — VITAMIN D 25 TOTAL: VITAMIN D, 25OH: 40 ng/mL (ref 30–100)

## 2023-01-25 ENCOUNTER — Encounter (HOSPITAL_COMMUNITY): Payer: Self-pay

## 2023-01-25 ENCOUNTER — Other Ambulatory Visit: Payer: Self-pay

## 2023-01-25 ENCOUNTER — Ambulatory Visit (INDEPENDENT_AMBULATORY_CARE_PROVIDER_SITE_OTHER): Payer: Self-pay | Admitting: Nurse Practitioner

## 2023-01-25 ENCOUNTER — Ambulatory Visit: Payer: Medicare Other | Admitting: Nurse Practitioner

## 2023-01-25 ENCOUNTER — Encounter (INDEPENDENT_AMBULATORY_CARE_PROVIDER_SITE_OTHER): Payer: Self-pay | Admitting: Nurse Practitioner

## 2023-01-25 VITALS — BP 158/81 | HR 55 | Ht 65.0 in | Wt 132.3 lb

## 2023-01-25 DIAGNOSIS — G959 Disease of spinal cord, unspecified: Secondary | ICD-10-CM | POA: Insufficient documentation

## 2023-01-25 DIAGNOSIS — Z01818 Encounter for other preprocedural examination: Secondary | ICD-10-CM

## 2023-01-25 NOTE — Progress Notes (Signed)
[place], [place]  [place] [address]  Operated by [facility]     Name: Caleb Knight MRN:  [id number]   Date: 01/25/2023 Age: 62 y.o.     HISTORY AND PHYSICAL     REQUESTED DOS: 02/08/23    PREOPERATIVE DIAGNOSIS: Cervical Myelopathy    NAME OF PROCEDURE: C3-t1 posterior fusion, C4-C7 decompression     HISTORY OF PRESENT ILLNESS  Caleb Knight is a 62 y.o. male who returns to the clinic today for his pre-operative history and physical examination.     The patient has been experiencing similar symptoms since last visit: neck pain, gait imbalance, bilateral hand numbness/tingling.  He has been dealing with this for several years now.  However, over the past year it has gotten much worse.  He states that often times he does not know where his left leg is in space.  He also has noticed significant weakness in his grip strength to the point where he has to compensate with this proximal upper extremity musculature.  He also complains of persistent bilateral hand numbness/tingling.  He also has significant neck pain that limits his ability to get good rest.  He has a remote history of a lower lumbar fracture that required surgery.  This happened in 2001 and he has been on disability since.  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  He has not done physical therapy nor received injections.  He does take gabapentin and provides him with some relief.  He states that over this last year all of the aforementioned symptoms have worsened over time and he presents today try to find a solution to these problems.  He notices difficulty buttoning buttons on his shirt, picking up coins from his wallet, and his handwriting has gotten worse.  He states he has some left lower extremity weakness that was the result from his injury back in 2001.       Today the pt would complain about more pain behind ears and into left leg.     After assessing the patient on 12/04/22 and  reviewing his imaging, Dr. Lollie Sails had an in depth surgical discussion regarding risks, recovery, and success rates of the above procudure and the patient wished to proceed.       PAST MEDICAL HISTORY  Denies  a history of DVT, PE, or bleeding disorder.   Denies unless bolded:  a history of diabetes, sleep apnea, myocardial infarction, stroke, or MRSA.    Denies a history of anesthesia complications.     Past Medical History:   Diagnosis Date    Hypertension     Mitral valve prolapse     Neck pain     Osteoarthritis           There is no problem list on file for this patient.       PAST SURGICAL HISTORY  Past Surgical History:   Procedure Laterality Date    HX BACK SURGERY      Lumbar        MEDICATIONS   Outpatient Medications Marked as Taking for the 01/25/23 encounter (Office Visit) with Gara Kroner, FNP-C   Medication Sig    amitriptyline (ELAVIL) 50 mg Oral Tablet Take 1 Tablet (50 mg total) by mouth Every night    atenoloL (TENORMIN) 25 mg Oral Tablet Take 1 Tablet (25 mg total) by mouth Once a day (Patient taking differently: Take 0.5 Tablets (12.5 mg  total) by mouth Once a day)    gabapentin (NEURONTIN) 100 mg Oral Capsule Take 1 Capsule (100 mg total) by mouth Three times a day    hydrOXYzine HCL (ATARAX) 25 mg Oral Tablet Take 1 Tablet (25 mg total) by mouth Twice per day as needed for Anxiety    meloxicam (MOBIC) 15 mg Oral Tablet Take 1 Tablet (15 mg total) by mouth Once a day    omega-3-DHA-EPA-fish oil (FISH OIL) 1,000 mg (120 mg-180 mg) Oral Capsule Take 1 Capsule by mouth Once a day    traZODone (DESYREL) 100 mg Oral Tablet Take 1 Tablet (100 mg total) by mouth Every night        ALLERGIES  Admits   an allergy to latex, or anesthesia. Is NOT allergic to antibiotics.    Allergies   Allergen Reactions    Latex, Natural Rubber         SOCIAL HISTORY   Social History     Substance and Sexual Activity   Alcohol Use Yes    Comment: occasional      Social History     Tobacco Use   Smoking Status Never    Smokeless Tobacco Former        FAMILY HISTORY   Family Medical History:       Problem Relation (Age of Onset)    COPD Brother    Cancer Brother    Diabetes Brother, Daughter    Elevated Lipids Daughter    Hearing Loss Mother    Heart Attack Father    Heart Disease Mother, Father, Sister    Hypertension (High Blood Pressure) Mother, Father               REVIEW OF SYSTEMS  General: Negative for fever, night sweats, fatigue, and chills.  Cardiovascular: Negative for chest pain and palpitations.   Respiratory: Negative for shortness of breath and cough.   HEENT: Negative for headache, changes in vision/hearing/smell, and sore throat.   Gastrointestinal: Negative for dysphagia, abdominal pain, N/V/D, and hematochezia.   Genitourinary: Negative for dysuria, polyuria, changes in urination, and hematuria.   Hematologic: Negative for nosebleeds and unexplained/easy bruising or bleeding.   Musculoskeletal: Positive for arthralgias.     All other systems reviewed and negative.    PHYSICAL EXAMINATION   BP (!) 158/81   Pulse 55   Ht 1.651 m (5\' 5" )   Wt 60 kg (132 lb 4.4 oz)   BMI 22.01 kg/m       General Assessment: Patient is alert and oriented x 3 and in no signs of distress.  Psychosocial: Pleasant. Normal affect.   Head: Normocephalic. Atraumatic.   EENT: Within normal limits.   Neck: Supple. Full ROM. No noted lymphadenopathy. Trachea midline.  Skin: Warm and well perfused.  Cardiovascular: RRR. NMRG. 2+ Peripheral pulses.   Respiratory: No retractions or cyanosis present. Normal respiratory effort. Clear to auscultation bilaterally.   Abdomen: Soft and non-tender. No guarding, masses, rebound, or organomegaly/hepatosplenomegaly. Bowel sounds are present and normoactive.   Musculoskeletal: Gait steady and balanced, somewhat widebased. Arthritic changes of bilat finger joints, limits ROM somewhat  Skin: Skin overlying the cervical spine is without lesions    Sensation:   Upper Ext. Lateral Arm Rad Forearm Index  Fing Little Finger Uln Forearm   Right 2/2 2/2 2/2 2/2 2/2   Left 2/2 2/2 2/2 2/2 2/2     Manual Muscle Grading:  Upper Ext. Deltoid Bicep Tricep Wrist Ext. Wrist Flex. Finger Flex.  Intrinsics   Right 5/5 5/5 5/5 5/5 5/5 5/5 4+/5   Left 5/5 5/5 5/5 5/5 5/5 5/5 4/5     Reflexes:  Upper Ext. Biceps BR Triceps   Right 2 2 2    Left 3 2 2          IMAGING  None today     MRI of the cervical spine done 11/19/22 demonstrate multilevel spondylotic changes, severe cervical stenosis worse at the C3-C4, C4-C5, C5-C6 levels with severe cord compression and cord deformation.       ASSESSMENT & PLAN   1. The patient is scheduled for surgery for a C3-T1 posterior fusion with C4-c7 decomp to be performed on 02/08/23.      2. The risks and benefits of this surgical procedure were discussed at length with patient. The risks include but are not limited to blood loss, infection, nerve damage, paralysis, cosmetic deformity, need for additional surgery, persistent pain, dural leak, non-union, fixation/hardware failure, fracture, visceral/vascular injury, adjacent segment problems, failure to relieve symptoms, medical problems and/or anesthesia problems such as heart attack, stroke, DVT, PE, kidney failure, pneumonia, sepsis, visual problems/blindness, and even death. We explained that we are obligated to explain the risks and alternative treatments so that the patient can make an informed decision.  Surgical Consent was obtained and the Consent Form was signed and witnessed.  The patient consents to transfusion of blood or blood products as well as to being photographed or filmed during the course of treatment or operation. All questions were answered to the best of my ability. The patient voiced understanding and provided informed consent which was witnessed appropriately.  Dr. Lollie Sails signed the consent today.     3. Medical records were reviewed at today's visit and the patient saw pre-admission testing and will obtain any outstanding labs  and/or imaging studies. Pre-operative orders were placed.    The pt was given hibiclens and advised to wash with this PM before and AM of Sx.    4. The patient was instructed to stop 1 week prior to surgery any OTC Supplements, ASA or other NSAIDs, and Plavix. Stop Coumadin 5 days prior to surgery, any ACE or ARB 24 hours prior to surgery. If taking Metformin, hold AM of surgery. GLP-1 Agonists should be held for one half-life before surgery: Exenatide [Byetta] should be held on the day of surgery, weekly Injectible drugs (Example: Semaglutide [Ozempic], Dulaglutide [Trulicity]) should be held for ONE WEEK.       If a patient has not held their GLP-1 receptor agonist, then that patient should be considered a "full stomach" NO MATTER THE FASTING TIME.      5. The patient denies history of substance abuse treatment.  I have reviewed prior medication history in the medical record for this patient. I have also reviewed information contained in the state controlled prescription drug monitoring database (PDMP/NarxCheck). The surgery for which opioid prescription is indicated is a surgery. The post-operative opioid, dosage, frequency, and dispense # are stated above. The duration of opioid prescriptions by Dr. Lollie Sails  is 6 weeks, starting on day of surgery. Narcotic consent and contract WAS reviewed and signed in clinic today. I discussed with the patient the [facility] opioid policy.  The patient understands that they will be provided with a prescription of a limited quantity of pain medication postoperatively. They are also aware that we will wean the narcotic medications postoperatively to minimize the risk of physical dependency (all of our surgical patients are to be  off all narcotics by 4-6 weeks postop), but we will do so taking into consideration the extent of surgery done and the patient's pain response postoperatively.     Post-op visits scheduled for 2 weeks post-op.    The patient was instructed to call with  any concerns prior to surgery.  All the patient's questions were answered at this time and he understood and agreed to this plan.     Day of surgery orders were placed. Proceed with surgery as scheduled.     On the day of the encounter, a total of  60 minutes was spent on this patient encounter including review of historical information, examination, documentation and post-visit activities.     Gara Kroner, NP  Department of Orthopaedics-Spine              CC:  Dinah Beers, MD  [address] [id number]   [place], [place], [place]  [address]  [place],  [place] [address]

## 2023-01-26 ENCOUNTER — Inpatient Hospital Stay (HOSPITAL_COMMUNITY)
Admission: RE | Admit: 2023-01-26 | Discharge: 2023-01-26 | Disposition: A | Discharge status: Home / Self Care | Payer: Medicare Other | Source: Ambulatory Visit

## 2023-01-26 ENCOUNTER — Encounter (HOSPITAL_COMMUNITY): Payer: Self-pay

## 2023-01-26 HISTORY — DX: Cardiac arrhythmia, unspecified: I49.9

## 2023-01-26 HISTORY — DX: Essential (primary) hypertension: I10

## 2023-01-28 ENCOUNTER — Telehealth: Payer: Medicare Other | Attending: ORTHOPEDIC SURGERY | Admitting: Family

## 2023-01-28 ENCOUNTER — Encounter (HOSPITAL_BASED_OUTPATIENT_CLINIC_OR_DEPARTMENT_OTHER): Payer: Self-pay | Admitting: Family

## 2023-01-28 DIAGNOSIS — Z01818 Encounter for other preprocedural examination: Secondary | ICD-10-CM

## 2023-01-28 DIAGNOSIS — I4891 Unspecified atrial fibrillation: Secondary | ICD-10-CM

## 2023-01-28 DIAGNOSIS — G959 Disease of spinal cord, unspecified: Secondary | ICD-10-CM

## 2023-01-28 DIAGNOSIS — F419 Anxiety disorder, unspecified: Secondary | ICD-10-CM

## 2023-01-28 NOTE — Patient Instructions (Signed)
MEDICATION INSTRUCTIONS    Take the following medications the morning of surgery: atenolol     Hold NSAIDs (Motrin, Advil, Aleve, Ibuprofen, Naprosyn), vitamins, fish oil, and herbal supplements 7 days prior to the procedure.      DIET INSTRUCTIONS   Regular diet until 8 hours prior to arrival. Clear liquids until 2 hours prior to arrival. No tobacco/nicotine or mints for 8 hours prior to arrival.

## 2023-01-28 NOTE — H&P (Signed)
Addendum:  none      [facility]  Department of Anesthesiology  Optimization Visit  History and Physical     Caleb Knight, Caleb Knight, 62 y.o. male Medical Record Number: [id number]   Date of Birth:  [birth year] Date of Service:  01/28/23    Information Obtained from: patient Surgeon:  Surgeon(s):  Gerre Pebbles, MD    Date of Surgery: 02/08/2023  Planned anesthesia: General   Estimated Case Length: 210 Min     TELEMEDICINE DOCUMENTATION:    Patient Location:  MyChart video visit from home address: [address]  [place] [address]    Patient/family aware of provider location:  yes  Patient/family consent for telemedicine:  yes  Examination observed and performed by:  Caleb Cowper, APRN,FNP-BC      CHIEF COMPLAINT  Pre-Operative Optimization Visit for DECOMPRESSION SPINE CERVICAL POSTERIOR WITH FUSION AND INSTRUMENTATION AND ALLOGRAFT 3 LEVELS OR MORE WITH NAVIGATION:  due to Caleb Knight.      RECOMMENDATION  SUMMARY: The surgery is considered elective.  The surgery is considered intermediate intensity .  Recommend  proceeding towards surgery.    Patient has the following outstanding needs for optimization:   None  Patient would benefit from active incentive spirometer, early ambulation following surgery and multi-modal pain management approach as appropriate.  DVT prophylaxis as appropriate.  Please see addendum for any updates after 01/28/2023.        ASSESSMENT & PLAN  1.) Preop Exam  Plan: Proceed with surgery as scheduled. EKG 01/20/23 (sinus brady) and LABS 01/20/23 reviewed. Patient will need T&S x2 drawn the morning of surgery, as he did not have this completed with [facility] labs.     2.) Cervical Myelopathy  Assessment: Chronic. Worsening symptoms.   Plan: Proceed with surgery as scheduled. Preop plan as above.     3.) Atrial Fib  Assessment: ECHO (EF 55-60%). Denies chest pain and/or shortness of breath.  Plan: EKG reviewed. Continue atenolol. Follow up with Cardiology as scheduled.     4.)  Anxiety  Assessment: Stable on elavil and hydroxyzine prn.   Plan: Follow up with PCP as scheduled. Patient is requesting something different to be prescribed for the morning of surgery. I instructed him to reach out to PCP to discuss options.      MEDICATION INSTRUCTIONS    Take the following medications the morning of surgery: atenolol     Hold NSAIDs (Motrin, Advil, Aleve, Ibuprofen, Naprosyn), vitamins, fish oil, and herbal supplements 7 days prior to the procedure.      DIET INSTRUCTIONS   Regular diet until 8 hours prior to arrival. Clear liquids until 2 hours prior to arrival. No tobacco/nicotine or mints for 8 hours prior to arrival.        HPI  Caleb Knight is a 62 y.o. male that presents with need for pre-operative optimization.      Patient has a hx of worsening neck pain. His pain has been present for years, and has been worsening He also has symptoms of gait problems and bilateral hand numbness.       Operative Reference Anesthesia  ASA  3    METS  >4    History of difficult intubation No    Personal or family history of anesthesia problems No    OSA (STOP-BANG score) No      Operative Reference Surgery  Allergy to the following: latex, metal/nickel, iodine/shellfish, acrylic, egg Yes - LATEX    Recent Hospitalization (within the last  3 months) No    Past history of DVT/PE No    Current Tobacco Use  No    Current Alcohol Use Yes, social drink occ    Current Drug Use  No      Assessment Tools   Hx of Lung Disease   No Ariscat Score: n/a      Concern for Cardiac Ischemia No N/a   History of PE/DVT No Caprini Score: n/a     History of Liver Disease No VOCAL-PENN Score: n/a     Frailty Score No FRAIL Score: 0    []  F= Fatigue in the past month? (More unusual fatigue than normal or inability to complete ADLs)  []  R= Resistance- difficulty climbing a flight of stairs?   []  A= Ambulation- difficulty walking one block on level land?  []   I= Illness (HTN, DM, Cancer (other than minor skin CA), Chronic Lung  Disease, Heart Attack, CHF, Angina, Asthma, Athritis, Stroke, CKD); 5+=1 point; <5=0  []   L= Loss of weight- have you lost >5% of your body weight in the past year?    0= Robust health; Pre-Frail= 1-2; Frail= 3-4; Severely Frail= 5    AD8 (Age>65) No AD8 Score: n/a     Pain Team Referral No Rationale: n/a       Past Medical History:   Diagnosis Date    Dysrhythmias     Afib    Essential hypertension     controlled with medication    Mitral valve prolapse     Neck pain     Osteoarthritis        Past Surgical History:   Procedure Laterality Date    HX BACK SURGERY      Lumbar       Family Medical History:       Problem Relation (Age of Onset)    COPD Brother    Cancer Brother    Diabetes Brother, Daughter    Elevated Lipids Daughter    Hearing Loss Mother    Heart Attack Father    Heart Disease Mother, Father, Sister    Hypertension (High Blood Pressure) Mother, Father                Review of Systems   Constitutional: Negative for fever (last 7 days), chills (last 7 days) and fatigue.   Skin:  Negative for rash and open wound.   HENT: Positive for dental problems and hearing loss. Negative for difficulty swallowing.   Exercise Tolerance: Negative for less than 4 METS.   Cardiovascular:  Negative for chest pain, edema and orthopnea.   Respiratory:  Negative for oxygen dependent, shortness of breath (at rest) and shortness of breath (with exertion).    Gastrointestinal:  Negative for heartburn, abdominal pain, diarrhea and constipation.   Genitourinary: Negative.    Musculoskeletal:  Positive for neck pain and back pain. Negative for joint pain.   Family Anesthesia History: Negative for malignant hyperthermia precautions and pseudocholinesterase deficiency.   Anesthesia: Negative for difficult intubation and PONV.   Neurological:  Negative for dizziness, headaches, muscle weakness and memory loss.   Psychiatric/Behavioral:  Positive for physiological symptoms of anxiety. Negative for depression.        Exam:  There  were no vitals taken for this visit.      There is no height or weight on file to calculate BMI.    Anesthesia Exam:   Mallampati:  not assessed   Mouth Opening:  unable to assess  Neck Mobility: limited   Other HENT Concerns: No    Physical Exam  Musculoskeletal:         General: No edema.              CONSULTS: Patient has been evaluated and optimized by [facility], Dr Lisabeth Register.          Caleb Cowper, APRN,FNP-BC  01/28/2023, 13:32    Perioperative Evaluation [place]    It should be emphasized that the [facility] Provider consultation is to assess the patient's medical conditions and provide recommendations to optimize the patient to decrease peri-operative complications.  Decision on whether or not to proceed to surgery is made at the care team's discretion.

## 2023-02-01 ENCOUNTER — Other Ambulatory Visit (INDEPENDENT_AMBULATORY_CARE_PROVIDER_SITE_OTHER): Payer: Self-pay | Admitting: Nurse Practitioner

## 2023-02-01 MED ORDER — DIAZEPAM 5 MG TABLET
ORAL_TABLET | ORAL | 0 refills | Status: DC
Start: 2023-02-01 — End: 2024-07-11

## 2023-02-01 NOTE — Progress Notes (Signed)
Spoke to pt Re severe preSx anxiety. Rx'd 5mg  Valium #2 no refills, he is hoping to be able to use to sleep in the nights prior. Discussed risks, do NOT take day of surgery unless d/w anesthesia.

## 2023-02-08 ENCOUNTER — Inpatient Hospital Stay (HOSPITAL_COMMUNITY): Payer: Medicare Other | Admitting: Family

## 2023-02-08 ENCOUNTER — Inpatient Hospital Stay (HOSPITAL_BASED_OUTPATIENT_CLINIC_OR_DEPARTMENT_OTHER): Payer: Medicare Other | Admitting: Family

## 2023-02-08 ENCOUNTER — Observation Stay (HOSPITAL_BASED_OUTPATIENT_CLINIC_OR_DEPARTMENT_OTHER): Payer: Medicare Other

## 2023-02-08 ENCOUNTER — Encounter (HOSPITAL_COMMUNITY): Payer: Self-pay | Admitting: ORTHOPEDIC SURGERY

## 2023-02-08 ENCOUNTER — Other Ambulatory Visit: Payer: Self-pay

## 2023-02-08 ENCOUNTER — Encounter (HOSPITAL_COMMUNITY)
Admission: RE | Disposition: A | Discharge status: Home / Self Care | Payer: Self-pay | Source: Ambulatory Visit | Attending: ORTHOPEDIC SURGERY

## 2023-02-08 ENCOUNTER — Inpatient Hospital Stay (HOSPITAL_COMMUNITY): Payer: Medicare Other

## 2023-02-08 ENCOUNTER — Observation Stay
Admission: RE | Admit: 2023-02-08 | Discharge: 2023-02-09 | Disposition: A | Discharge status: Home / Self Care | Payer: Medicare Other | Source: Ambulatory Visit | Attending: ORTHOPEDIC SURGERY | Admitting: ORTHOPEDIC SURGERY

## 2023-02-08 DIAGNOSIS — M4712 Other spondylosis with myelopathy, cervical region: Secondary | ICD-10-CM

## 2023-02-08 DIAGNOSIS — Z9889 Other specified postprocedural states: Secondary | ICD-10-CM

## 2023-02-08 DIAGNOSIS — G959 Disease of spinal cord, unspecified: Principal | ICD-10-CM | POA: Diagnosis present

## 2023-02-08 DIAGNOSIS — M542 Cervicalgia: Secondary | ICD-10-CM | POA: Insufficient documentation

## 2023-02-08 DIAGNOSIS — M4802 Spinal stenosis, cervical region: Secondary | ICD-10-CM

## 2023-02-08 DIAGNOSIS — Z981 Arthrodesis status: Secondary | ICD-10-CM

## 2023-02-08 DIAGNOSIS — M47892 Other spondylosis, cervical region: Secondary | ICD-10-CM

## 2023-02-08 SURGERY — DECOMPRESSION SPINE CERVICAL POSTERIOR FUSION WITH INSTRUMENTATION 3 LEVELS OR MORE
Anesthesia: General | Site: Spine Cervical | Wound class: Clean Wound: Uninfected operative wounds in which no inflammation occurred

## 2023-02-08 MED ORDER — PROPOFOL 10 MG/ML INTRAVENOUS EMULSION
INTRAVENOUS | Status: DC | PRN
Start: 2023-02-08 — End: 2023-02-08
  Administered 2023-02-08: 100 ug/kg/min via INTRAVENOUS
  Administered 2023-02-08: 125 ug/kg/min via INTRAVENOUS
  Administered 2023-02-08 (×2): 150 ug/kg/min via INTRAVENOUS
  Administered 2023-02-08: 135 ug/kg/min via INTRAVENOUS
  Administered 2023-02-08: 150 ug/kg/min via INTRAVENOUS
  Administered 2023-02-08: 0 ug/kg/min via INTRAVENOUS

## 2023-02-08 MED ORDER — ACETAMINOPHEN 1,000 MG/100 ML (10 MG/ML) INTRAVENOUS SOLUTION
Freq: Once | INTRAVENOUS | Status: DC | PRN
Start: 2023-02-08 — End: 2023-02-08
  Administered 2023-02-08: 1000 mg via INTRAVENOUS

## 2023-02-08 MED ORDER — ROCURONIUM 10 MG/ML INTRAVENOUS SYRINGE WRAPPER
INJECTION | INTRAVENOUS | Status: AC
Start: 2023-02-08 — End: 2023-02-08
  Filled 2023-02-08: qty 5

## 2023-02-08 MED ORDER — SODIUM CHLORIDE 0.9% FLUSH BAG - 250 ML
INTRAVENOUS | Status: DC | PRN
Start: 2023-02-08 — End: 2023-02-09

## 2023-02-08 MED ORDER — FENTANYL (PF) 50 MCG/ML INJECTION SOLUTION
12.5000 ug | INTRAMUSCULAR | Status: DC | PRN
Start: 2023-02-08 — End: 2023-02-08

## 2023-02-08 MED ORDER — SODIUM CHLORIDE 0.9 % (FLUSH) INJECTION SYRINGE
2.0000 mL | INJECTION | INTRAMUSCULAR | Status: DC | PRN
Start: 2023-02-08 — End: 2023-02-09

## 2023-02-08 MED ORDER — SODIUM CHLORIDE 0.9 % (FLUSH) INJECTION SYRINGE
2.0000 mL | INJECTION | Freq: Three times a day (TID) | INTRAMUSCULAR | Status: DC
Start: 2023-02-08 — End: 2023-02-09
  Administered 2023-02-08 – 2023-02-09 (×2): 2 mL

## 2023-02-08 MED ORDER — ONDANSETRON HCL (PF) 4 MG/2 ML INJECTION SOLUTION
4.0000 mg | Freq: Three times a day (TID) | INTRAMUSCULAR | Status: DC | PRN
Start: 2023-02-08 — End: 2023-02-09
  Administered 2023-02-09: 4 mg via INTRAVENOUS
  Filled 2023-02-08: qty 2

## 2023-02-08 MED ORDER — DEXTROSE 5% IN WATER (D5W) FLUSH BAG - 250 ML
INTRAVENOUS | Status: DC | PRN
Start: 2023-02-08 — End: 2023-02-09

## 2023-02-08 MED ORDER — SODIUM CHLORIDE 0.9 % INTRAVENOUS SOLUTION
INTRAVENOUS | Status: DC | PRN
Start: 2023-02-08 — End: 2023-02-08
  Administered 2023-02-08: 0 via INTRAVENOUS

## 2023-02-08 MED ORDER — DEXAMETHASONE SODIUM PHOSPHATE 4 MG/ML INJECTION SOLUTION
INTRAMUSCULAR | Status: AC
Start: 2023-02-08 — End: 2023-02-08
  Filled 2023-02-08: qty 2

## 2023-02-08 MED ORDER — EPHEDRINE SULFATE 5 MG/ML INTRAVENOUS SOLUTION
INTRAVENOUS | Status: AC
Start: 2023-02-08 — End: 2023-02-08
  Filled 2023-02-08: qty 10

## 2023-02-08 MED ORDER — HYDROMORPHONE (PF) 0.5 MG/0.5 ML INJECTION SYRINGE
INJECTION | Freq: Once | INTRAMUSCULAR | Status: DC | PRN
Start: 2023-02-08 — End: 2023-02-08
  Administered 2023-02-08 (×2): .25 mg via INTRAVENOUS

## 2023-02-08 MED ORDER — PHENYLEPHRINE 1 MG/10 ML (100 MCG/ML) IN 0.9 % SOD.CHLORIDE IV SYRINGE
INJECTION | INTRAVENOUS | Status: AC
Start: 2023-02-08 — End: 2023-02-08
  Filled 2023-02-08: qty 10

## 2023-02-08 MED ORDER — MORPHINE 2 MG/ML INTRAVENOUS SYRINGE
2.0000 mg | INJECTION | INTRAVENOUS | Status: DC | PRN
Start: 2023-02-08 — End: 2023-02-09
  Administered 2023-02-09 (×3): 2 mg via INTRAVENOUS
  Filled 2023-02-08 (×3): qty 1

## 2023-02-08 MED ORDER — LACTATED RINGERS INTRAVENOUS SOLUTION
INTRAVENOUS | Status: DC
Start: 2023-02-08 — End: 2023-02-08
  Administered 2023-02-08: 0 mL via INTRAVENOUS

## 2023-02-08 MED ORDER — FENTANYL (PF) 50 MCG/ML INJECTION SOLUTION
Freq: Once | INTRAMUSCULAR | Status: DC | PRN
Start: 2023-02-08 — End: 2023-02-08
  Administered 2023-02-08: 100 ug via INTRAVENOUS

## 2023-02-08 MED ORDER — PROPOFOL 10 MG/ML IV BOLUS
INJECTION | Freq: Once | INTRAVENOUS | Status: DC | PRN
Start: 2023-02-08 — End: 2023-02-08
  Administered 2023-02-08 (×3): 50 mg via INTRAVENOUS
  Administered 2023-02-08: 40 mg via INTRAVENOUS
  Administered 2023-02-08 (×2): 50 mg via INTRAVENOUS
  Administered 2023-02-08: 150 mg via INTRAVENOUS

## 2023-02-08 MED ORDER — OXYCODONE-ACETAMINOPHEN 5 MG-325 MG TABLET
2.0000 | ORAL_TABLET | ORAL | 0 refills | Status: AC | PRN
Start: 2023-02-08 — End: 2023-02-16
  Filled 2023-02-08: qty 84, 7d supply, fill #0

## 2023-02-08 MED ORDER — SODIUM CHLORIDE 0.9 % INTRAVENOUS SOLUTION
Freq: Once | INTRAVENOUS | Status: AC
Start: 2023-02-08 — End: 2023-02-08
  Filled 2023-02-08: qty 10

## 2023-02-08 MED ORDER — LIDOCAINE (PF) 20 MG/ML (2 %) INJECTION SOLUTION
INTRAMUSCULAR | Status: AC
Start: 2023-02-08 — End: 2023-02-08
  Filled 2023-02-08: qty 5

## 2023-02-08 MED ORDER — PROPOFOL 10 MG/ML INTRAVENOUS EMULSION
INTRAVENOUS | Status: AC
Start: 2023-02-08 — End: 2023-02-08
  Filled 2023-02-08: qty 50

## 2023-02-08 MED ORDER — LACTATED RINGERS INTRAVENOUS SOLUTION
INTRAVENOUS | Status: DC
Start: 2023-02-08 — End: 2023-02-09

## 2023-02-08 MED ORDER — GELATIN MATRIX SEALANT (FLOSEAL) 10 ML KIT
PACK | Freq: Once | CUTANEOUS | Status: DC | PRN
Start: 2023-02-08 — End: 2023-02-08

## 2023-02-08 MED ORDER — FENTANYL (PF) 50 MCG/ML INJECTION SOLUTION
INTRAMUSCULAR | Status: AC
Start: 2023-02-08 — End: 2023-02-08
  Filled 2023-02-08: qty 2

## 2023-02-08 MED ORDER — DEXAMETHASONE SODIUM PHOSPHATE 4 MG/ML INJECTION SOLUTION
4.0000 mg | Freq: Once | INTRAMUSCULAR | Status: DC | PRN
Start: 2023-02-08 — End: 2023-02-08

## 2023-02-08 MED ORDER — MIDAZOLAM 1 MG/ML INJECTION SOLUTION
INTRAMUSCULAR | Status: AC
Start: 2023-02-08 — End: 2023-02-08
  Filled 2023-02-08: qty 2

## 2023-02-08 MED ORDER — SODIUM CHLORIDE 0.9 % (FLUSH) INJECTION SYRINGE
2.0000 mL | INJECTION | Freq: Three times a day (TID) | INTRAMUSCULAR | Status: DC
Start: 2023-02-08 — End: 2023-02-09
  Administered 2023-02-08 – 2023-02-09 (×3): 2 mL

## 2023-02-08 MED ORDER — LIDOCAINE (PF) 100 MG/5 ML (2 %) INTRAVENOUS SYRINGE
INJECTION | Freq: Once | INTRAVENOUS | Status: DC | PRN
Start: 2023-02-08 — End: 2023-02-08
  Administered 2023-02-08: 60 mg via INTRAVENOUS

## 2023-02-08 MED ORDER — POLYETHYLENE GLYCOL 3350 17 GRAM ORAL POWDER PACKET
17.0000 g | Freq: Every day | ORAL | Status: DC
Start: 2023-02-08 — End: 2023-02-09
  Administered 2023-02-08 – 2023-02-09 (×2): 0 g via ORAL
  Filled 2023-02-08: qty 1

## 2023-02-08 MED ORDER — CEFAZOLIN 10 GRAM SOLUTION FOR INJECTION
2.0000 g | Freq: Three times a day (TID) | INTRAMUSCULAR | Status: AC
Start: 2023-02-08 — End: 2023-02-09
  Administered 2023-02-09: 0 g via INTRAVENOUS
  Administered 2023-02-09 (×2): 2 g via INTRAVENOUS
  Administered 2023-02-09: 0 g via INTRAVENOUS
  Filled 2023-02-08 (×2): qty 10

## 2023-02-08 MED ORDER — OXYCODONE 10 MG TABLET
10.0000 mg | ORAL_TABLET | ORAL | Status: DC | PRN
Start: 2023-02-08 — End: 2023-02-09
  Administered 2023-02-08 – 2023-02-09 (×5): 10 mg via ORAL
  Filled 2023-02-08 (×5): qty 1

## 2023-02-08 MED ORDER — EPHEDRINE SULFATE 5 MG/ML INTRAVENOUS SOLUTION
Freq: Once | INTRAVENOUS | Status: DC | PRN
Start: 2023-02-08 — End: 2023-02-08
  Administered 2023-02-08 (×2): 5 mg via INTRAVENOUS

## 2023-02-08 MED ORDER — SENNOSIDES 8.6 MG-DOCUSATE SODIUM 50 MG TABLET
1.0000 | ORAL_TABLET | Freq: Two times a day (BID) | ORAL | Status: DC
Start: 2023-02-08 — End: 2023-02-09
  Administered 2023-02-08: 1 via ORAL
  Administered 2023-02-09: 0 via ORAL
  Filled 2023-02-08 (×2): qty 1

## 2023-02-08 MED ORDER — CEFAZOLIN 10 GRAM SOLUTION FOR INJECTION
2.0000 g | Freq: Once | INTRAMUSCULAR | Status: AC
Start: 2023-02-08 — End: 2023-02-08
  Administered 2023-02-08 (×2): 2 g via INTRAVENOUS
  Filled 2023-02-08: qty 10

## 2023-02-08 MED ORDER — DEXAMETHASONE SODIUM PHOSPHATE 4 MG/ML INJECTION SOLUTION
INTRAMUSCULAR | Status: AC
Start: 2023-02-08 — End: 2023-02-08
  Filled 2023-02-08: qty 1

## 2023-02-08 MED ORDER — ONDANSETRON HCL (PF) 4 MG/2 ML INJECTION SOLUTION
Freq: Once | INTRAMUSCULAR | Status: DC | PRN
Start: 2023-02-08 — End: 2023-02-08
  Administered 2023-02-08: 4 mg via INTRAVENOUS

## 2023-02-08 MED ORDER — BISACODYL 10 MG RECTAL SUPPOSITORY
10.0000 mg | Freq: Every evening | RECTAL | Status: DC | PRN
Start: 2023-02-08 — End: 2023-02-09

## 2023-02-08 MED ORDER — SUGAMMADEX 100 MG/ML INTRAVENOUS SOLUTION
Freq: Once | INTRAVENOUS | Status: DC | PRN
Start: 2023-02-08 — End: 2023-02-08
  Administered 2023-02-08: 120 mg via INTRAVENOUS

## 2023-02-08 MED ORDER — PHENYLEPHRINE 50 MG/250 ML (200 MCG/ML) IN 0.9 % SODIUM CHLORIDE IV
INTRAVENOUS | Status: DC | PRN
Start: 2023-02-08 — End: 2023-02-08
  Administered 2023-02-08: 0 ug/kg/min via INTRAVENOUS
  Administered 2023-02-08: .3 ug/kg/min via INTRAVENOUS
  Administered 2023-02-08: .15 ug/kg/min via INTRAVENOUS

## 2023-02-08 MED ORDER — MIDAZOLAM (PF) 1 MG/ML INJECTION SOLUTION
Freq: Once | INTRAMUSCULAR | Status: DC | PRN
Start: 2023-02-08 — End: 2023-02-08
  Administered 2023-02-08: 2 mg via INTRAVENOUS

## 2023-02-08 MED ORDER — KETAMINE 10 MG/ML INJECTION WRAPPER
Freq: Once | INTRAMUSCULAR | Status: DC | PRN
Start: 2023-02-08 — End: 2023-02-08
  Administered 2023-02-08 (×2): 10 mg via INTRAVENOUS
  Administered 2023-02-08: 30 mg via INTRAVENOUS

## 2023-02-08 MED ORDER — ONDANSETRON HCL (PF) 4 MG/2 ML INJECTION SOLUTION
INTRAMUSCULAR | Status: AC
Start: 2023-02-08 — End: 2023-02-08
  Filled 2023-02-08: qty 2

## 2023-02-08 MED ORDER — REMIFENTANIL 1 MG INTRAVENOUS SOLUTION
INTRAVENOUS | Status: AC
Start: 2023-02-08 — End: 2023-02-08
  Filled 2023-02-08: qty 2

## 2023-02-08 MED ORDER — ONDANSETRON HCL (PF) 4 MG/2 ML INJECTION SOLUTION
4.0000 mg | Freq: Once | INTRAMUSCULAR | Status: DC | PRN
Start: 2023-02-08 — End: 2023-02-08

## 2023-02-08 MED ORDER — VANCOMYCIN 1,000 MG INTRAVENOUS INJECTION
Freq: Once | INTRAVENOUS | Status: AC
Start: 2023-02-08 — End: 2023-02-08
  Filled 2023-02-08: qty 30

## 2023-02-08 MED ORDER — LORAZEPAM 2 MG/ML INJECTION WRAPPER
1.0000 mg | INTRAMUSCULAR | Status: DC | PRN
Start: 2023-02-08 — End: 2023-02-08
  Administered 2023-02-08: 1 mg via INTRAVENOUS
  Filled 2023-02-08: qty 1

## 2023-02-08 MED ORDER — ACETAMINOPHEN 325 MG TABLET
650.0000 mg | ORAL_TABLET | Freq: Four times a day (QID) | ORAL | Status: DC
Start: 2023-02-08 — End: 2023-02-09
  Administered 2023-02-08 – 2023-02-09 (×4): 650 mg via ORAL
  Filled 2023-02-08 (×4): qty 2

## 2023-02-08 MED ORDER — DOCUSATE SODIUM 100 MG CAPSULE
100.0000 mg | ORAL_CAPSULE | Freq: Two times a day (BID) | ORAL | 0 refills | Status: AC
Start: 2023-02-08 — End: 2023-03-11
  Filled 2023-02-08: qty 60, 30d supply, fill #0

## 2023-02-08 MED ORDER — DEXAMETHASONE SODIUM PHOSPHATE 4 MG/ML INJECTION SOLUTION
Freq: Once | INTRAMUSCULAR | Status: DC | PRN
Start: 2023-02-08 — End: 2023-02-08
  Administered 2023-02-08: 8 mg via INTRAVENOUS

## 2023-02-08 MED ORDER — PROPOFOL 10 MG/ML INTRAVENOUS EMULSION
INTRAVENOUS | Status: AC
Start: 2023-02-08 — End: 2023-02-08
  Filled 2023-02-08: qty 20

## 2023-02-08 MED ORDER — HYDROMORPHONE (PF) 0.5 MG/0.5 ML INJECTION SYRINGE
0.4000 mg | INJECTION | INTRAMUSCULAR | Status: AC | PRN
Start: 2023-02-08 — End: 2023-02-08
  Administered 2023-02-08 (×5): 0.4 mg via INTRAVENOUS
  Filled 2023-02-08 (×4): qty 0.5

## 2023-02-08 MED ORDER — KETAMINE 10 MG/ML INJECTION WRAPPER
INTRAMUSCULAR | Status: AC
Start: 2023-02-08 — End: 2023-02-08
  Filled 2023-02-08: qty 5

## 2023-02-08 MED ORDER — ROCURONIUM 10 MG/ML INTRAVENOUS SYRINGE WRAPPER
INJECTION | Freq: Once | INTRAVENOUS | Status: DC | PRN
Start: 2023-02-08 — End: 2023-02-08
  Administered 2023-02-08: 50 mg via INTRAVENOUS
  Administered 2023-02-08 (×2): 10 mg via INTRAVENOUS
  Administered 2023-02-08: 20 mg via INTRAVENOUS

## 2023-02-08 MED ORDER — TAMSULOSIN 0.4 MG CAPSULE
0.4000 mg | ORAL_CAPSULE | Freq: Every evening | ORAL | Status: DC
Start: 2023-02-08 — End: 2023-02-09
  Administered 2023-02-08: 0.4 mg via ORAL
  Filled 2023-02-08: qty 1

## 2023-02-08 MED ORDER — EPINEPHRINE 1 MG/ML (1 ML) INJECTION SOLUTION
INTRAMUSCULAR | Status: DC | PRN
Start: 2023-02-08 — End: 2023-02-08

## 2023-02-08 MED ORDER — METHOCARBAMOL 500 MG TABLET
500.0000 mg | ORAL_TABLET | Freq: Four times a day (QID) | ORAL | Status: DC
Start: 2023-02-08 — End: 2023-02-09
  Administered 2023-02-08 – 2023-02-09 (×3): 500 mg via ORAL
  Filled 2023-02-08 (×3): qty 1

## 2023-02-08 MED ORDER — DOCUSATE SODIUM 100 MG CAPSULE
100.0000 mg | ORAL_CAPSULE | Freq: Two times a day (BID) | ORAL | Status: DC
Start: 2023-02-08 — End: 2023-02-09
  Administered 2023-02-08 – 2023-02-09 (×2): 0 mg via ORAL
  Filled 2023-02-08: qty 1

## 2023-02-08 MED ORDER — FENTANYL (PF) 50 MCG/ML INJECTION SOLUTION
25.0000 ug | INTRAMUSCULAR | Status: DC | PRN
Start: 2023-02-08 — End: 2023-02-08

## 2023-02-08 MED ORDER — OXYCODONE 5 MG TABLET
5.0000 mg | ORAL_TABLET | ORAL | Status: DC | PRN
Start: 2023-02-08 — End: 2023-02-09

## 2023-02-08 MED ORDER — METHOCARBAMOL 500 MG TABLET
500.0000 mg | ORAL_TABLET | Freq: Four times a day (QID) | ORAL | 0 refills | Status: AC
Start: 2023-02-08 — End: 2023-03-10
  Filled 2023-02-08: qty 60, 15d supply, fill #0

## 2023-02-08 MED ORDER — REMIFENTANIL 50 MCG/ML INFUSION - FOR ANES
INTRAVENOUS | Status: DC | PRN
Start: 2023-02-08 — End: 2023-02-08
  Administered 2023-02-08: 0 ug/kg/min via INTRAVENOUS
  Administered 2023-02-08: .075 ug/kg/min via INTRAVENOUS
  Administered 2023-02-08: .1 ug/kg/min via INTRAVENOUS

## 2023-02-08 MED ORDER — PROCHLORPERAZINE EDISYLATE 10 MG/2 ML (5 MG/ML) INJECTION SOLUTION
5.0000 mg | Freq: Once | INTRAMUSCULAR | Status: DC | PRN
Start: 2023-02-08 — End: 2023-02-08
  Administered 2023-02-08: 5 mg via INTRAVENOUS
  Filled 2023-02-08: qty 2

## 2023-02-08 MED ORDER — HYDROMORPHONE (PF) 0.5 MG/0.5 ML INJECTION SYRINGE
INJECTION | INTRAMUSCULAR | Status: AC
Start: 2023-02-08 — End: 2023-02-08
  Filled 2023-02-08: qty 0.5

## 2023-02-08 MED ORDER — DEXMEDETOMIDINE 4 MCG/ML IV DILUTION
Freq: Once | INTRAMUSCULAR | Status: DC | PRN
Start: 2023-02-08 — End: 2023-02-08
  Administered 2023-02-08 (×2): 8 ug via INTRAVENOUS
  Administered 2023-02-08: 4 ug via INTRAVENOUS
  Administered 2023-02-08 (×2): 8 ug via INTRAVENOUS
  Administered 2023-02-08: 4 ug via INTRAVENOUS

## 2023-02-08 MED ORDER — HYDROMORPHONE (PF) 0.5 MG/0.5 ML INJECTION SYRINGE
0.2000 mg | INJECTION | INTRAMUSCULAR | Status: DC | PRN
Start: 2023-02-08 — End: 2023-02-08

## 2023-02-08 MED ORDER — NALOXONE 4 MG/ACTUATION NASAL SPRAY
1.0000 | NASAL | 0 refills | Status: DC | PRN
Start: 2023-02-08 — End: 2024-06-26
  Filled 2023-02-08: qty 2, 1d supply, fill #0

## 2023-02-08 SURGICAL SUPPLY — 20 items
BIT DRILL 171MM 2.4MM 2 FLUTE QUICK COUPLE STP 65MM (SURGICAL CUTTING SUPPLIES) ×1 IMPLANT
BIT DRILL 3.8MM 3MM PREC NEURO STRL LF  DISP (SURGICAL CUTTING SUPPLIES) ×1 IMPLANT
COLLAR CERV UNIV VST DIAL HT ADJ MTN RSTRC ATO LOCK (ORTHOPEDICS (NOT IMPLANTS)) ×1 IMPLANT
CONV USE 338639 - PACK SURG CSTM SPINE NONST DISP LF (CUSTOM TRAYS & PACK) ×1
CONV USE 338639 - PACK SURG CUSTOM SPINE NONST DISP LF (CUSTOM TRAYS & PACK) ×1 IMPLANT
COVER 53X24IN MAYOSTAND PRXM STRL DISP EQP SMS LF (DRAPE/PACKS/SHEETS/OR TOWEL) ×2 IMPLANT
ELECTRODE ESURG BLADE 4IN 3/32IN EDGE STRL .2IN DISP INSL STD SHAFT XTD LF (SURGICAL CUTTING SUPPLIES) ×1 IMPLANT
ELECTRODE ESURG BLADE 6.5IN 3/32IN EDGE STRL .2IN DISP INSL STD SHAFT XTD LF (SURGICAL CUTTING SUPPLIES) ×2 IMPLANT
GARMENT COMPRESS MED CALF CENTAURA NYL VASOGRAD LTWT BRTHBL SEQ FIL BLU 18- IN (MED SURG SUPPLIES) ×1 IMPLANT
KIT DRAIN 10FR PVC 3 SPRG RESERVOIR TROCAR Y CONN JP 1/8IN RND 400ML STRL LF  DISP (WOUND CARE SUPPLY) IMPLANT
PACK SURG CSTM SPINE NONST DISP LF (CUSTOM TRAYS & PACK) ×1
PIN ADULT MAYFIELD WNG GRV PLASTIC CRNM SKULL DISP STRL (MED SURG SUPPLIES) ×1 IMPLANT
ROD SPINAL 75MM 3.5MM LORD TI NONST (IMPLANTS SPINE) ×2 IMPLANT
SCREW BONE 3.5MM 12MM PLY SPINE NONST 4MM ROD (IMPLANTS SPINE) ×7 IMPLANT
SCREW BONE 5MM 26MM PLY SPINE PEDCL NONST 4MM ROD (IMPLANTS SPINE) ×2 IMPLANT
SCREW SET LOCK CAP TI SPINE T15 STD NONST (IMPLANTS SPINE) ×9 IMPLANT
SET 82IN REG CLAMP N-PYRG IRRG 10 GTT/ML Y TUR DEHP BLADDER STRL LF (MED SURG SUPPLIES) ×1 IMPLANT
SPONGE GAUZE 4X4IN MDCHC COTTON 12 PLY TY 7 LF  STRL DISP (WOUND CARE SUPPLY) ×1 IMPLANT
TAPE MICROFOAM 4IN 15284 BX/3 (WOUND CARE SUPPLY) ×1 IMPLANT
TIP SUCT ARGYLE CURITY FRZR 10FR BRSS PLASTIC NI REM OBDURATOR NCDTV HNDL STRL LF  DISP (MED SURG SUPPLIES) ×2 IMPLANT

## 2023-02-08 NOTE — Progress Notes (Signed)
[facility]  Service: Ortho Spine  Attending: Lollie Sails  Post-Op Note  02/08/2023    Name: Caleb Knight  DOB: 1961/11/02  MRN: [id number]    Admit Date:02/08/2023  Date: 02/08/2023    RECENT ORTHO SURGERY:  C3-7 posterior decompression, C3-T1 posterior spinal fusion (4/1 - [facility])     SUBJECTIVE:  62 y.o. male resting in PACU. Pain is well controlled. Awake and alert. No new complaints.     OBJECTIVE:  Vitals: BP (!) 145/92   Pulse 62   Temp 36.5 C (97.7 F)   Resp (!) 21   Ht 1.702 m (5\' 7" )   Wt 60.4 kg (133 lb 2.5 oz)   SpO2 96%   BMI 20.86 kg/m     NAD, resting comfortably in bed  Breathing non-labored    MOTOR   D B WE T FF HI  Right  5 5 5 5 5 5   Left 5 5 5 5 5 5      HF Q TA EHL G  Right 5 5 5 5 5   Left 5 5 5 5 5     SENSORY   C5 C6 C7 C8 T1  Right 2 2 2 2 2    Left 2 2 2 2 2       L2 L3 L4 L5 S1  Right 2 2 2 2 2   Left 2 2 2 2 2       PERTINENT LABS:   CBC: No results for input(s): "WBC", "HGB", "HCT", "PLTCNT" in the last 72 hours.  Coagulation studies: No results for input(s): "APTT", "PROTHROMTME", "INR" in the last 72 hours.  Inflammatory markers: No results for input(s): "SEDRATE", "CREAPROINFLA" in the last 72 hours.    ASSESSMENT:  62 y.o. male Day of Surgery s/p above mentioned procedure.     PLAN:  - Weightbearing: WBAT  - PT/OT: ordered; recs pending  - DVT prophylaxis: SCDs; ambulate  - Antibiotics: 24h ancef  - Pain: po, IV  - Drain: in place, monitor output  - Dressing: located posterior neck, okay for nursing to reinforce prn  - Diet: regular  - Dispo: pending PT/OT, patient progress  - Follow-up: will see back in Dr. Victorino Sparrow clinic in 2 weeks. Order placed in [place].    --    Jyl Heinz, MD  Orthopaedic Surgery, PGY-4  [facility]   Pager [phone]

## 2023-02-08 NOTE — H&P (Signed)
[facility]                                                     H&P Update Form    [name], [name], 62 y.o. male  Date of Admission:  02/08/2023  Date of Birth:  [birth year]    02/08/2023    Pre-Surgical H & P updated the day of the procedure.  1.  H&P completed within 30 days of surgical procedure was performed by Gara Kroner, FNP-C on 02/01/23 and has been reviewed, the patient has been examined, and no change has occured in the patients condition since the H&P was completed.     Comments: No medication changes, denies cp/sob/nvd/fevers/chills, surgical site marked, consent in chart.    2.  Patient continues to be appropiate candidate for planned surgical procedure. YES    --    Jyl Heinz, MD  Orthopaedic Surgery, PGY-4  [facility]

## 2023-02-08 NOTE — Brief Op Note (Signed)
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   Multilevel cervical stenosis  3.   Neck pain      Post-op Diagnosis:  Same    Procedure:  1. Posterior decompression, C3-C7.  2. Posterior instrumentation, C3-T1  3. Posterior arthrodesis, C3-T1   4. Application of locally harvested autograft  5. Use of intraoperative fluoroscopy.      Primary Surgeon:  Gerre Pebbles     All Surgeons/Assistants:  Surgeon(s):  Gerre Pebbles, MD  Pryor Ochoa, MD    Anesthesia:  General Endotracheal    Staff:  Circulator: Emi Belfast, RN  Relief Circulator: Lolita Lenz, RN  Relief Scrub: Gevena Barre, CST  Scrub Person: Earna Coder, CST    Estimated Blood Loss:  600    Fluids:  Per Anesthesia Record    Implants:  Implant Name Type Inv. Item Serial No. Manufacturer Lot No. LRB No. Used Action   ROD SPINAL 75MM 3.5MM LORD TI NONST - [id number]  ROD SPINAL 75MM 3.5MM LORD TI NONST N/A [employer] N/A N/A 2 Implanted   SCREW SET LOCK CAP TI SPINE T15 STD NONST - [id number]  SCREW SET LOCK CAP TI SPINE T15 STD NONST N/A [employer] N/A N/A 9 Implanted   SCREW BONE 3.5MM 12MM PLY SPINE NONST 4MM ROD [id number]  SCREW BONE 3.5MM 12MM PLY SPINE NONST 4MM ROD N/A [employer] N/A N/A 7 Implanted   SCREW BONE 5MM 26MM PLY SPINE PEDCL NONST 4MM ROD - [id number]  SCREW BONE 5MM 26MM PLY SPINE PEDCL NONST 4MM ROD N/A [employer] N/A N/A 2 Implanted       Specimen:  None    Indications:  This is a 62 year old male I initially met during in outpatient clinic visit.  He describes several year history of cervical pain as well as symptoms that were consistent with progressive moderate myelopathy.  His MRI series did demonstrate multilevel cervical stenosis.  Based on the progression as well as severity of the symptoms, we did discuss the risks and benefits associated with a posterior  decompression and fusion as well as laminoplasty.  During the course of our conversation, he did describe that his neck pain was a significant factor, and did motivate him to proceed with selecting a laminectomy with fusion.  We did spend some time discussing the fact that performing a instrumented fusion would significantly decrease his range of motion.  Other risks that we discussed included, but were not limited to, infection, wound healing issues, neck pain, trapezial pain, incomplete resolution in his preoperative symptoms, spinal cord injury, nerve root injury, durotomy, persistent CSF leak, need for subsequent surgery, as well as medical complications during surgery in his postoperative recovery.  Based on our discussions, I do feel the patient has sufficient information provided informed consent.     Operative Procedure in Detail:    I met with the patient, his wife, in his son in the preoperative holding area.  There was some discussion about how which range of motion he would lose with a posterior instrumented fusion.  As we were not extending the instrumentation to include the upper cervical spine, I did discuss with him that he would lose a proximally half of his flexion-extension, as well as axial rotation.  The operative site was signed.  He was then transported to the operative suite by  the anesthesia team.  A time-out was held identifying the correct patient as well as the correct procedure.  He was then placed under general endotracheal anesthesia.  A Mayfield head holder was then applied in the standard fashion.  He while being positioned prone, there was a persistent air leak.  Due to this, he was then repositioned supine on his gurney.  He was then reintubated.  It was felt that he had a more secure airway after this change.  Following this, he was repositioned prone and care was taken to pad all bony prominences.  Posterior cervical spine was then prepped and draped in the standard fashion.  A  2nd time-out was held identifying the correct patient as well as the correct procedure.  Weight based Ancef was then infused for antibiotic prophylaxis.  Following this, an incision was then made on the midline of the posterior cervical spine, from C3 down to T1.  Electrocautery was used to elevate the bilateral paraspinal musculature.  Following this, we did place a Kocher clamp within the C2-3 interval, to confirm our localization.  After this, we did dural tracts for our lateral mass screws bilaterally at C3, C4, C5, and C6.  Using the [facility] technique, a bur was used to create a starting point in a 12 mm step drill was then used to cannulate the lateral mass.  We did feel that we had perforated anteriorly on the left at C6 and agreed to not place a screw at this level.  Alveolar screw tracts had appropriate bony containment.  Hemostatic agent was then placed within the lateral mass tracts.  Using anatomic landmarks, we did identify a starting point for our bilateral T1 pedicle screws.  A bur was used to perforate the dorsal cortex and a gear shift was used to cannulate the pedicle, bilaterally.  After this, we ensure that we would appropriate bony containment using a ball-tip Feeler gauge.  The screw tracts were then tapped and appropriate size 5 mm screws were then placed.  We confirmed appropriate localization using both AP and lateral views.  After this, we are irrigated the wound with saline.  We took down the interspinous ligaments at C2-3 as well as at C7-T1.  A bur was used to create a laminectomy defect, bilaterally at C3, C4, C5, C6, and C7.  The lamina were then elevated EN bloc and used for our local autograft.  3 L of vancomycin infused saline were then used to irrigate the wound.  Hemostasis was obtained.  We then decorticated from C3 down to T1 using a 3 mm bur.  After this, we placed appropriate size 3.5 mm screws bilaterally at C3, C4 and C5, and on the right at C6.  The assistant then broke  scrub and loosen the Mayfield in I was able to restore of the appropriate sagittal balance, afterwards Mayfield was then locked in place.  Appropriate size 3.5 mm titanium rod was then fashion to to match the patient's anatomy.  This was then secured in place using end caps.  These may end caps were then final tightened down to the manufacturer's specification.  We obtain AP and lateral views and were satisfied with the instrumentation placement.  Local autograft from our laminectomy was then used for our arthrodesis from C3-T1.  A subfascial drain was then placed in the standard fashion.  The wound was closed in layers using 0, 2-0, and 3-0 Vicryl.  At the end of the procedure, all counts were correct.  Staples  were then used in the skin.  The patient was then positioned supine on a gurney.  The Mayfield head holder was removed without issue.  He was then extubated and taken to the PACU in stable condition.    I was present for all key and/or critical portions of the care and immediately available at all times.    Complications:  None      Gerre Pebbles, MD  Assistant Professor   Department of Orthopaedic Surgery          Date: 02/08/2023 Time:14:26

## 2023-02-08 NOTE — Anesthesia Postprocedure Evaluation (Signed)
Anesthesia Post Op Evaluation    Patient: Caleb Knight  Procedure(s) with comments:  DECOMPRESSION SPINE CERVICAL POSTERIOR FUSION WITH INSTRUMENTATION 3 LEVELS OR MORE - C3-T1 POSTERIOR FUSION  C4-7 DECOMPRESSION    Last Vitals:Temperature: 36.1 C (97 F) (02/08/23 1526)  Heart Rate: (!) 45 (02/08/23 1530)  BP (Non-Invasive): (!) 171/85 (02/08/23 1530)  Respiratory Rate: (!) 11 (02/08/23 1530)  SpO2: 100 % (02/08/23 1530)    No notable events documented.    Patient is sufficiently recovered from the effects of anesthesia to participate in the evaluation and has returned to their pre-procedure level.  Patient location during evaluation: PACU       Patient participation: complete - patient participated  Level of consciousness: awake and alert and responsive to verbal stimuli    Pain management: adequate  Airway patency: patent    Anesthetic complications: no  Cardiovascular status: acceptable  Respiratory status: acceptable  Hydration status: acceptable  Patient post-procedure temperature: Pt Normothermic   PONV Status: Absent

## 2023-02-08 NOTE — Anesthesia Preprocedure Evaluation (Signed)
ANESTHESIA PRE-OP EVALUATION  Planned Procedure: DECOMPRESSION SPINE CERVICAL POSTERIOR FUSION WITH INSTRUMENTATION 3 LEVELS OR MORE (Spine Cervical)  Review of Systems                   Pulmonary     Cardiovascular    No peripheral edema,        GI/Hepatic/Renal           Endo/Other         Neuro/Psych/MS        Cancer                        Physical Assessment      Airway       Mallampati: III      Neck ROM: limited  Mouth Opening: fair.      No endotracheal tube present      Dental           (+) poor dentition           Pulmonary    Breath sounds clear to auscultation  (-) no rhonchi, no decreased breath sounds, no wheezes, no rales and no stridor     Cardiovascular    Rhythm: regular  Rate: Normal  (-) no friction rub, carotid bruit is not present, no peripheral edema and no murmur     Other findings              Plan  ASA 3     Planned anesthesia type: general     general anesthesia with endotracheal tube intubation      PONV Plan:  I plan to administer pharmcologic prophalaxis antiemetics  POV PLAN:   plan for postoperative opioid use            Intravenous induction     Anesthesia issues/risks discussed are: Dental Injuries, PONV, Cardiac Events/MI, Intraoperative Awareness/ Recall, Blood Loss, Sore Throat and Post-op Pain Management.  Anesthetic plan and risks discussed with patient  signed consent obtained      Use of blood products discussed with patient who consented to blood products.      Patient's NPO status is appropriate for Anesthesia.           Plan discussed with CRNA.

## 2023-02-08 NOTE — Anesthesia Transfer of Care (Signed)
ANESTHESIA TRANSFER OF CARE   Caleb Knight is a 62 y.o. ,male, Weight: 60.4 kg (133 lb 2.5 oz)   had Procedure(s) with comments:  DECOMPRESSION SPINE CERVICAL POSTERIOR FUSION WITH INSTRUMENTATION 3 LEVELS OR MORE - C3-T1 POSTERIOR FUSION  C4-7 DECOMPRESSION  performed  02/08/23   Primary Service: Gerre Pebbles, MD    Past Medical History:   Diagnosis Date   . Dysrhythmias     Afib   . Essential hypertension     controlled with medication   . Mitral valve prolapse    . Neck pain    . Osteoarthritis       Allergy History as of 02/08/23       LATEX, NATURAL RUBBER         Noted Status Severity Type Reaction    01/26/23 1500 [place] 12/26/21 Active High      12/26/21 0716 [place], [place] 12/26/21 Active                     I completed my transfer of care / handoff to the receiving personnel during which we discussed:  Access, Airway, All key/critical aspects of case discussed, Analgesia, Antibiotics, Expectation of post procedure, Fluids/Product, Gave opportunity for questions and acknowledgement of understanding, Labs and PMHx      Post Location: PACU                        Additional Info:Pt transported to PACU in stable condition w/ patent airway and oxygen via SFM at 6 lpm. No distress noted. Report given RN.                                   Last OR Temp: Temperature: 36.1 C (97 F)  ABG:  POTASSIUM   Date Value Ref Range Status   01/20/2023 4.7 3.5 - 5.1 mmol/L Final     CALCIUM   Date Value Ref Range Status   01/20/2023 10.5 (H) 8.6 - 10.3 mg/dL Final     Comment:     Gadolinium-containing contrast can interfere with calcium measurement.     Calculated P Axis   Date Value Ref Range Status   01/20/2023 19 degrees Final     Calculated R Axis   Date Value Ref Range Status   01/20/2023 59 degrees Final     Calculated T Axis   Date Value Ref Range Status   01/20/2023 59 degrees Final     Airway:* No LDAs found *  Blood pressure (!) 165/95, pulse 51, temperature 36.1 C (97 F), resp. rate (!) 11,  height 1.702 m (5\' 7" ), weight 60.4 kg (133 lb 2.5 oz), SpO2 100%.

## 2023-02-09 ENCOUNTER — Observation Stay (HOSPITAL_BASED_OUTPATIENT_CLINIC_OR_DEPARTMENT_OTHER): Payer: Medicare Other

## 2023-02-09 ENCOUNTER — Other Ambulatory Visit: Payer: Self-pay

## 2023-02-09 DIAGNOSIS — M4304 Spondylolysis, thoracic region: Secondary | ICD-10-CM

## 2023-02-09 DIAGNOSIS — Z981 Arthrodesis status: Secondary | ICD-10-CM

## 2023-02-09 DIAGNOSIS — Z9889 Other specified postprocedural states: Secondary | ICD-10-CM

## 2023-02-09 DIAGNOSIS — Z4789 Encounter for other orthopedic aftercare: Secondary | ICD-10-CM

## 2023-02-09 DIAGNOSIS — M4312 Spondylolisthesis, cervical region: Secondary | ICD-10-CM

## 2023-02-09 DIAGNOSIS — M4184 Other forms of scoliosis, thoracic region: Secondary | ICD-10-CM

## 2023-02-09 DIAGNOSIS — M2578 Osteophyte, vertebrae: Secondary | ICD-10-CM

## 2023-02-09 LAB — CBC WITH DIFF
BASOPHIL #: <0.1 10*3/uL (ref <=0.20)
BASOPHIL %: 0.4 %
EOSINOPHIL #: <0.1 10*3/uL (ref <=0.50)
EOSINOPHIL %: 0.1 %
HCT: 28.2 % — ABNORMAL LOW (ref 38.9–52.0)
HGB: 9.1 g/dL — ABNORMAL LOW (ref 13.4–17.5)
IMMATURE GRANULOCYTE #: <0.1 10*3/uL (ref <0.10)
IMMATURE GRANULOCYTE %: 0.4 % (ref 0.0–1.0)
LYMPHOCYTE #: 1.9 10*3/uL (ref 1.00–4.80)
LYMPHOCYTE %: 13.4 %
MCH: 27 pg (ref 26.0–32.0)
MCHC: 32.3 g/dL (ref 31.0–35.5)
MCV: 83.7 fL (ref 78.0–100.0)
MONOCYTE #: 1.26 10*3/uL — ABNORMAL HIGH (ref 0.20–1.10)
MONOCYTE %: 8.9 %
MPV: 12.2 fL (ref 8.7–12.5)
NEUTROPHIL #: 10.94 10*3/uL — ABNORMAL HIGH (ref 1.50–7.70)
NEUTROPHIL %: 76.8 %
PLATELETS: 180 10*3/uL (ref 150–400)
RBC: 3.37 10*6/uL — ABNORMAL LOW (ref 4.50–6.10)
RDW-CV: 14 % (ref 11.5–15.5)
WBC: 14.2 10*3/uL — ABNORMAL HIGH (ref 3.7–11.0)

## 2023-02-09 LAB — BASIC METABOLIC PANEL
ANION GAP: 6 mmol/L (ref 4–13)
BUN/CREA RATIO: 15 (ref 6–22)
BUN: 12 mg/dL (ref 8–25)
CALCIUM: 8.3 mg/dL — ABNORMAL LOW (ref 8.6–10.3)
CHLORIDE: 107 mmol/L (ref 96–111)
CO2 TOTAL: 24 mmol/L (ref 23–31)
CREATININE: 0.79 mg/dL (ref 0.75–1.35)
ESTIMATED GFR - MALE: >90 mL/min/BSA (ref >=60)
GLUCOSE: 111 mg/dL (ref 65–125)
POTASSIUM: 4 mmol/L (ref 3.5–5.1)
SODIUM: 137 mmol/L (ref 136–145)

## 2023-02-09 MED ORDER — MELATONIN 3 MG TABLET
6.0000 mg | ORAL_TABLET | Freq: Every evening | ORAL | Status: DC
Start: 2023-02-09 — End: 2023-02-09
  Administered 2023-02-09: 6 mg via ORAL
  Filled 2023-02-09: qty 2

## 2023-02-09 MED ORDER — MELATONIN 3 MG TABLET
6.0000 mg | ORAL_TABLET | Freq: Every evening | ORAL | Status: DC
Start: 2023-02-09 — End: 2023-02-09

## 2023-02-09 NOTE — Care Plan (Signed)
[place]  Occupational Therapy Initial Evaluation    Patient Name: Caleb Knight  Date of Birth: Oct 03, 1961  Height: Height: 170.2 cm (5\' 7" )  Weight: Weight: 60.4 kg (133 lb 2.5 oz)  Room/Bed: 22/A  Payor: MEDICARE / Plan: MEDICARE PART A AND B / Product Type: Medicare /     Assessment:   (P) [name] tolerated OT evaluation adequately. Pt stood from bedside chair and ambulated 285ft with brisk pattern and impulsiveness independently. Pt declined all ADLs and other moblity. Wife able to assist with mobility and ADLs at home. Pt recommended to go home once medically cleared and stable for DC      Discharge Needs:   Equipment Recommendation: (P) none anticipated        Discharge Disposition: (P) home    JUSTIFICATION OF DISCHARGE RECOMMENDATION   Based on current diagnosis, functional performance prior to admission, and current functional performance, this patient requires continued OT services in (P) home  in order to achieve significant functional improvements.    Plan:   Current Intervention:      To provide Occupational therapy services (P) Evaluation Only, (P) evaluation only.       The risks/benefits of therapy have been discussed with the patient/caregiver and he/she is in agreement with the established plan of care.       Subjective & Objective        02/09/23 [id number]   Therapist Pager   OT Assigned/ Pager # Denyse Amass [phone]   Rehab Session   Document Type evaluation   OT Visit Date 02/09/23   Total OT Minutes: 8   Patient Effort adequate   Symptoms Noted During/After Treatment none   General Information   Patient Profile Reviewed yes   Onset of Illness/Injury or Date of Surgery 02/08/23   Pertinent History of Current Functional Problem C3-7 posterior decompression, C3-T1 posterior spinal fusion   Medical Lines PIV Line   Respiratory Status room air   Existing Precautions/Restrictions fall precautions;full code;c-collar   Pre Treatment Status   Pre Treatment Patient Status  Patient sitting in bedside chair or w/c;Call light within reach;Telephone within reach;Patient safety alarm activated;Nurse approved session   Support Present Pre Treatment  Family present   Communication Pre Treatment  Nurse   Communication Pre Treatment Comment RN medically cleared patient to be seen by therapy   Mutuality/Individual Preferences   Individualized Care Needs OOB with SUP   Living Environment   Lives With spouse   Living Arrangements house   Home Accessibility stairs to enter home   Home Main Entrance   Stair Railings, [place] on both sides of stairs   Number of Stairs, Main Entrance five   Functional Level Prior   Ambulation 0 - independent   Transferring 0 - independent   Toileting 0 - independent   Bathing 0 - independent   Dressing 0 - independent   Eating 0 - independent   Communication 0 - understands/communicates without difficulty   Self-Care   Equipment Currently Used at Home no   Vital Signs   O2 Delivery Pre Treatment room air   O2 Delivery Post Treatment room air   Vitals Comment VSS   Pain Assessment   Additional Documentation Pain Scale: Numbers Pre/Post-Treatment (Group)   Pain Assessment   Pre/Posttreatment Pain Comment neck pain but not rated; given morphine prior to eval   Coping/Psychosocial Response Interventions   Plan Of Care Reviewed With patient   Cognitive Assessment/Interventions  Behavior/Mood Observations impulsive   RUE Assessment   RUE Assessment X- Exceptions   RUE Other seemed to be [facility] but not receptive to letting therapy assess   LUE Assessment   LUE Assessment X-Exceptions   LUE Other seemed to be [facility] but not receptive to letting therapy assess   Mobility Assessment/Training   Additional Documentation Gait Assessment/Treatment (Group)   Bed Mobility Assessment/Treatment   Comment not observed during session   Transfer Assessment/Treatment   Sit-Stand Independence independent   Stand-Sit Independence independent   Gait Assessment/Treatment   Total  Distance Ambulated 250   Distance in Feet 250   Independence  independent   Bathing Assessment/Training   Comment declined   Upper Body Dressing Assessment/Training   Comment declined   Lower Body Dressing Assessment/Training   Comment declined   Toileting Assessment/Training   Comment declined   Grooming/Oral Hygeine  Assessment/Training   Comment declined   Self-Feeding Assessment/Training   Comment declined   Balance Skill Training   Comment unsupported   Sitting Balance: Static good balance   Sitting, Dynamic (Balance) good balance   Sit-to-Stand Balance good balance   Standing Balance: Static good balance   Standing Balance: Dynamic good balance   Orthotics/Misc Device    Cervical Collar On and Aligned;Aspen   Post Treatment Status   Post Treatment Patient Status Patient sitting in bedside chair or w/c;Call light within reach;Telephone within reach;Patient safety alarm activated   Support Present Post Treatment  Family present   Clinical Impression   Functional Level at Time of Session [name] tolerated OT evaluation adequately. Pt stood from bedside chair and ambulated 264ft with brisk pattern and impulsiveness independently. Pt declined all ADLs and other moblity. Wife able to assist with mobility and ADLs at home. Pt recommended to go home once medically cleared and stable for DC   Criteria for Skilled Therapeutic Interventions Met (OT) no problems identified which require skilled intervention   Therapy Frequency Evaluation Only   Predicted Duration of Therapy evaluation only   Anticipated Equipment Needs at Discharge none anticipated   Anticipated Discharge Disposition home   Evaluation Complexity Justification   Occupational Profile Review Brief history   Performance Deficits None   Clinical Decision Making Low analytic complexity   Evaluation Complexity Low       Therapist:   Bartholomew Boards, OT   Pager #: [phone]

## 2023-02-09 NOTE — Discharge Summary (Signed)
[facility]  DISCHARGE SUMMARY    PATIENT NAME:  Caleb Knight, Caleb Knight  MRN:  [id number]  DOB:  11/15/1960    ENCOUNTER DATE:  02/08/2023  INPATIENT ADMISSION DATE:   DISCHARGE DATE:  02/09/2023    ATTENDING PHYSICIAN: Gerre Pebbles, MD  SERVICE: Rochel Brome  PRIMARY CARE PHYSICIAN: Dinah Beers, MD       No lay caregiver identified.    PRIMARY DISCHARGE DIAGNOSIS: Cervical myelopathy (CMS [facility])  Active Hospital Problems    Diagnosis Date Noted    Principal Problem: Cervical myelopathy (CMS HCC) [G95.9] 02/08/2023      Resolved Hospital Problems   No resolved problems to display.     There are no active non-hospital problems to display for this patient.          Current Discharge Medication List        START taking these medications.        Details   docusate sodium 100 mg Capsule  Commonly known as: COLACE   Take 1 Capsule (100 mg total) by mouth Twice daily for 30 days. Take while taking pain meds.  Qty: 60 Capsule  Refills: 0     methocarbamoL 500 mg Tablet  Commonly known as: ROBAXIN   500 mg, Oral, EVERY 6 HOURS (SCHEDULED), Take scheduled  Qty: 60 Tablet  Refills: 0     naloxone 4 mg/actuation Spray, Non-Aerosol  Commonly known as: NARCAN   Instill 1 Spray by INTRANASAL route Every 2 minutes as needed for Other (actual or suspected opioid overdose). Call 911 if given.  Qty: 2 Each  Refills: 0     oxyCODONE-acetaminophen 5-325 mg Tablet  Commonly known as: Percocet   Take 2 Tablets by mouth Every 4 hours as needed for Pain (Wean down as pain improves) for up to 7 days. Decrease DOSE and increase time between doses - as a means to wean off as pain improves.  Qty: 84 Tablet  Refills: 0            CONTINUE these medications - NO CHANGES were made during your visit.        Details   amitriptyline 50 mg Tablet  Commonly known as: ELAVIL   50 mg, Oral, NIGHTLY  Refills: 0     atenoloL 25 mg Tablet  Commonly known as: TENORMIN   25 mg, Oral, DAILY  Qty: 90 Tablet  Refills: 3     diazePAM 5 mg Tablet  Commonly known as:  VALIUM   2 hours prior to procedure take 1 tab. If still having severe anxiety 20mins prior to procedure may take an additional 1/2 to 1 tab. Do not drive.  Qty: 2 Tablet  Refills: 0     Fish OiL 1,000 mg (120 mg-180 mg) Capsule  Generic drug: omega-3-DHA-EPA-fish oil   1,000 mg, Oral, DAILY  Refills: 0     gabapentin 100 mg Capsule  Commonly known as: NEURONTIN   100 mg, Oral, 3 TIMES DAILY  Refills: 0     hydrOXYzine HCL 25 mg Tablet  Commonly known as: ATARAX   25 mg, Oral, 2 TIMES DAILY PRN  Refills: 0     meloxicam 15 mg Tablet  Commonly known as: MOBIC   15 mg, Oral, DAILY  Refills: 0     traZODone 100 mg Tablet  Commonly known as: DESYREL   100 mg, Oral, NIGHTLY  Refills: 0            Discharge med list refreshed?  YES     Allergies   Allergen Reactions    Latex, [facility]):   No orders of the defined types were placed in this encounter.    Surgical/Procedural Cases on this Admission       Case IDs Date Procedure Surgeon Location Status    [id number] 02/08/23 DECOMPRESSION SPINE CERVICAL POSTERIOR FUSION WITH INSTRUMENTATION 3 LEVELS OR MORE Gerre Pebbles, MD [place] [facility] [place] COURSE   BRIEF HPI:  This is a 62 y.o., male admitted for cervical myelopathy.  BRIEF HOSPITAL NARRATIVE:     Underwent C3-T1 PSF and C3-7 decompression on 02/08/23. Recovered well. Discharged to home on 02/09/23.     TRANSITION/POST DISCHARGE CARE/PENDING TESTS/REFERRALS: None    CONDITION ON DISCHARGE:  A. Ambulation: Full ambulation  B. Self-care Ability: Complete  C. Cognitive Status Alert and Oriented x 3  D. Code status at discharge:       LINES/DRAINS/WOUNDS AT DISCHARGE:   Patient Lines/Drains/Airways Status       Active Line / Dialysis Catheter / Dialysis Graft / Drain / Airway / Wound       Name Placement date Placement time Site Days    Peripheral IV Anterior;Left Dorsal Metacarpals  (top of hand) 02/08/23  1040  -- 1    Peripheral IV Right Forearm  02/08/23  1117  -- 1    Hemovac Posterior Neck 02/08/23  1237  -- 1    Wound  Incision Posterior Neck 02/08/23  1213  -- 1                    DISCHARGE DISPOSITION:  Home discharge with family.  DISCHARGE INSTRUCTIONS:  Post-Discharge Follow Up Appointments       Tuesday Feb 23, 2023    Return Patient Visit with Gerre Pebbles, MD at 11:00 AM      Orthopaedics, [place] [address]  [phone]             DISCHARGE INSTRUCTION - MISC    DISCHARGE INSTRUCTIONS:  Posterior Cervical Decompression and Fusion    Collar:  You will be provided 2 collars, one [place] (hard, plastic/green/gray with foam pads) and one [place] (beige/pink hard foam collar).    Wear the Aspen collar at all times (including sleep), except for shower - change into philadelphia collar for shower.  Hard collar will be worn for 6 weeks after surgery, after which we routinely switch you to a soft collar before weaning to no collar.    Wound Care:  Leave the dressing on your surgical wound as long as it has minimal drainage or if it stays dry.  May leave it on for up to 3-5 days as long as it stays clean (half or less has soilage).  Dressing is water proof.  Monitor for seepage from showers in periphery.  If soiled, wet, or excessive drainage, then may remove and change with dry gauze and tape (even if its the day of discharge).  Once you have changed to dry gauze and tape, then please shower with dressing on, and remove dressing after shower, pat incision dry, once dry, replace dry gauze and tape.    If the wound is clean and dry (no drainage) for at least 2 days, you may leave it uncovered and shower with incision uncovered.  This usually happens around day 5-7 after surgery.  You may be preferable to keep a clean, dry dressing on the wound to prevent clothing from rubbing against it.    Please have your wound checked twice daily for any signs of infection. If any of the below occur,  please call the office:  -Drainage from the incision  -Opening of the incision  -Fevers greater than 101 degrees (Fahrenheit)  -Flu-like symptoms  -Increased redness and/or tenderness    [[[[[ ** DO NOT put ANY lotions, creams, or ointments on your wound ** ]]]]]  [[[[[ ** DO NOT CLEAN WOUND with alcohol/peroxide or any caustic substances ** ]]]]]  Just simply keep it dry.  If sweating into dressings and skin is damp, let incision air-dry before placing new dressing.  If you have staples or sutures closing your incision, these will be removed at your follow-up visit in 2-3 weeks.   **Please do not cut the suture material.**  If there are steri-strips, let them fall off on their own, do not peel off.      Showering:  You may shower with waterproof dressings on, immediately after surgery.     After you switch to standard gauze and tape, you may let water run over the dressings.  After you start leaving the incision open, you may let water run over the incision but please do not scrub over it.   Hair washing is permissible when in the shower.   DO NOT soak/submerge the wound (Bath tub, hot tub, swimming pool, etc.) until 6 weeks after surgery (unless otherwise instructed).   If the wound is draining at all, do not shower.     Activity:  You may walk and climb stairs as tolerated. Walking outside (in nice weather only) or walking on a treadmill (no incline) is also allowed. Walk as much as possible, letting your discomfort be your guide.  DO NOT lift anything weighing more than 15 pounds for 3 months after surgery.    LIMIT overhead activities - you may reach into cupboards and wash your hair, however do not do any work for more than a few minutes with arms overhead.  Limit your sitting to 20-30 minute intervals. You should lie down or walk around in between sitting periods. There are no limitations on sitting in a recliner chair.  You may sleep in any position which makes you comfortable. It is normal to have a little  difficulty sleeping in the first few weeks following your surgery.    Driving:  You may not drive a car until told otherwise by your surgeon.   You may be a passenger for short distances (30-40 minutes). If you must take a longer trip, make several stops so that you can walk around and stretch your legs. Reclining the passenger seat may be more comfortable.    Pain:  You will be given pain medication at the time of discharge. Take it only as prescribed. As your pain decreases, you may decrease the amount of pain medication you take.  LIMIT over the counter anti-inflammatories (aleve, ibuprofen, motrin etc) for the first 6 weeks after surgery.    You may have some rebound zingers into fingers/shoulders after decompression but this should reliably improve in the first 3 weeks and resolve by 6 weeks after surgery.  If severe, please contact us.  Pain should NOT increase with rest.      Follow-up Appointments:  A follow-up clinic appointment should be made for you approximately  2-3 weeks after your surgery. This is often already scheduled before you have surgery.  If you need to confirm your appointment, please call [phone].     Questions or Concerns:   If you have any additional questions or concerns, or if you develop any significant worsening of your symptoms (pain, numbness, tingling, weakness, or trouble controlling bowel/bladder), please call [phone] or go to the Emergency Department.          Pryor Ochoa, MD    Copies sent to Care Team         Relationship Specialty Notifications Start End    Dinah Beers, MD PCP - General INTERNAL MEDICINE  11/20/21     Phone: [phone] Fax: [phone]         [address] [place] [address]    Lubertha Basque, MD PCP - Cardiologist [place]  Admissions 01/21/22     Phone: [phone] Fax: [phone]         [place] [address]            Referring providers can utilize [email or web address] to access  their referred [place] patient's information.

## 2023-02-09 NOTE — Care Plan (Signed)
[place]  Physical Therapy Initial Evaluation    Patient Name: Joangel Smathers  Date of Birth: 1961-03-11  Height: Height: 170.2 cm (5\' 7" )  Weight: Weight: 60.4 kg (133 lb 2.5 oz)  Room/Bed: 22/A  Payor: MEDICARE / Plan: MEDICARE PART A AND B / Product Type: Medicare /     Assessment:      Mr. Salber tolerated treatment well with no signs of adverse events. Pt was agitated when we told him he needed to get up and walk. Pt ambulated 243ft independently. Pt reported no soncerns about returning home following discharge.Recommend d/c home and d/c therapy.    Discharge Needs:    Equipment Recommendation: none anticipated      Discharge Disposition: home    JUSTIFICATION OF DISCHARGE RECOMMENDATION   Based on current diagnosis, functional performance prior to admission, and current functional performance, this patient requires continued PT services in home in order to achieve significant functional improvements in these deficit areas:  .    Plan:   Current Intervention:    To provide physical therapy services other (see comments) (D/c therapy)  for duration of evaluation only.    The risks/benefits of therapy have been discussed with the patient/caregiver and he/she is in agreement with the established plan of care.       Subjective & Objective        02/09/23 [id number]   Therapist Pager   PT Assigned/ Pager # [phone]/ [phone]   Rehab Session   Document Type evaluation   PT Visit Date 02/09/23   Total PT Minutes: 8   Patient Effort adequate   Symptoms Noted During/After Treatment none   General Information   Patient Profile Reviewed yes   Onset of Illness/Injury or Date of Surgery 02/08/23   Pertinent History of Current Functional Problem DECOMPRESSION SPINE CERVICAL POSTERIOR FUSION WITH INSTRUMENTATION 3 LEVELS OR MORE - C3-T1 POSTERIOR FUSION  C4-7 DECOMPRESSION   Medical Lines PIV Line   Respiratory Status room air   Existing Precautions/Restrictions fall precautions;full code    General Observations of Patient Pt sitting in chair upon arrival, slightly agitated but agreeable to therapy   Mutuality/Individual Preferences   Individualized Care Needs OOB w/ SUP   Patient-Specific Goals (Include Timeframe) 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   Plan of Care Reviewed With patient;spouse   Living Environment   Lives With spouse   Living Arrangements house   Home Assessment: No Problems Identified   Home Accessibility bed and bath on same level;stairs to enter home   Transportation Available car;family or friend will provide   [place] Pt lives in a house with his wife with 3 STE and everything on one level   Home Main Entrance   Number of Stairs, Main Entrance three   Stair Railings, Main Entrance railings on both sides of stairs   Functional Level Prior   Ambulation 0 - independent   Transferring 0 - independent   Toileting 0 - independent   Bathing 0 - independent   Dressing 0 - independent   Eating 0 - independent   Communication 0 - understands/communicates without difficulty   Self-Care   Equipment Currently Used at Home no   Pre Treatment Status   Pre Treatment Patient Status Nurse approved session;Telephone within reach;Call light within reach;Patient sitting in bedside chair or w/c;Patient safety alarm activated   Support Present Pre Treatment  Family present   Communication Pre Treatment  Nurse  Communication Pre Treatment Comment RN approved visit   Cognitive Assessment/Interventions   Behavior/Mood Observations alert;agitated;impulsive;behavior appropriate to situation, WNL/WFL   Orientation Status oriented x 4   Attention WNL/WFL   Follows Commands WNL;WFL   Comment Pt was agitated that therapy wanted to see him walk, but otherwise oriented and appropriate   Vital Signs   O2 Delivery Pre Treatment room air   O2 Delivery Post Treatment room air   Vitals Comment Vitals WNL   Pain Assessment   Pre/Posttreatment Pain Comment Post-op neck pain but did not formally rate   RLE  Assessment   RLE Assessment WFL- Within Functional Limits   LLE Assessment   LLE Assessment WFL- Within Functional Limits   Trunk Assessment   Trunk Assessment WFL-Within Functional Limits   Bed Mobility Assessment/Treatment   Comment none observed during treatment   Transfer Assessment/Treatment   Sit-Stand Independence independent   Stand-Sit Independence independent   Sit-Stand-Sit, Assist Device None   Transfer Comment Pt transferred from sit to stand and from stand to sit independently.   Gait Assessment/Treatment   Independence  independent   Distance in Feet 250   Gait Speed fast   Comment pt ambulated 270ft independently without any LOBs or issues   Balance Skill Training   Comment independent in standing   Sitting Balance: Static good balance   Sitting, Dynamic (Balance) good balance   Sit-to-Stand Balance good balance   Standing Balance: Static good balance   Standing Balance: Dynamic good balance   Orthotics/Misc Device    Cervical Collar [place];On and Aligned   Post Treatment Status   Post Treatment Patient Status Patient sitting in bedside chair or w/c;Call light within reach;Telephone within reach;Patient safety alarm activated   Support Present Post Treatment  Family present   Communication Post Treatment Nurse   Plan of Care Review   Plan Of Care Reviewed With patient   Basic Mobility Am-PAC/6Clicks Score (APPROVED Staff)   Turning in bed without bedrails 4   Lying on back to sitting on edge of flat bed 4   Moving to and from a bed to a chair 4   Standing up from chair 4   Walk in room 4   Climbing 3-5 steps with railing 4   6 Clicks Raw Score total 24   Standardized (t-scale) score 57.68   Patient Mobility Goal (JHHLM) 8- Walk 250 feet or more 3X/day   Exercise/Activity Level Performed 8- Walked 250 feet or more   Physical Therapy Clinical Impression   Assessment Mr. Biter tolerated treatment well with no signs of adverse events. Pt was agitated when we told him he needed to get up and walk. Pt  ambulated 221ft independently. Pt reported no concerns about returning home following discharge.Recommend d/c home and d/c therapy.   Patient/Family Goals Statement go home   Criteria for Skilled Therapeutic no   Pathology/Pathophysiology Noted musculoskeletal;neuromuscular   Therapy Frequency other (see comments)  (D/c therapy)   Predicted Duration of Therapy Intervention (days/wks) evaluation only   Anticipated Equipment Needs at Discharge (PT) none anticipated   Anticipated Discharge Disposition home   Functional Reporting   Functional Limitations were determined by: Professional clinical judgement   Evaluation Complexity Justification   Patient History: Co-morbidity/factors that impact Plan of Care 0 none that impact Plan of Care   Examination Components 1-2 Exam elements addressed   Presentation Stable: Uncomplicated, straight-forward, problem focused   Clinical Decision Making Low complexity   Evaluation Complexity Low complexity  Therapist:   Domingo Madeira, PHYSICAL THERAPY STUDENT   Pager #: [phone]

## 2023-02-09 NOTE — Care Plan (Signed)
Patient discharged home with family.  AVS reviewed with patient/care giver.  A written copy of the AVS and discharge instructions was given to the patient/care giver.  Questions sufficiently answered as needed.  Patient/care giver encouraged to follow up with PCP as indicated.  In the event of an emergency, patient/care giver instructed to call 911 or go to the nearest emergency room.  Both IV's removed and documented. Belongings sent to car w/ wife, pt had one bag on lap, room searched to be clear of all belongings. Patient taken to lobby by transport by wheel chair.

## 2023-02-09 NOTE — Nurses Notes (Signed)
Made service aware that patients wife is going to obtain pt's home medications from the car and give them to him. Service okay with this.

## 2023-02-09 NOTE — Progress Notes (Signed)
[facility]  Service: Ortho Spine  Attending: Lollie Sails  Progress Note  02/09/2023    Name: Caleb Knight  DOB: 1961/03/10  MRN: [id number]    Admit Date:02/08/2023  Date: 02/09/2023    RECENT ORTHO SURGERY:  C3-7 posterior decompression, C3-T1 posterior spinal fusion (4/1 - [facility])     SUBJECTIVE:  62 y.o. male resting in bed. Wife at bedside.    OBJECTIVE:  Vitals: BP 130/72   Pulse 61   Temp 36.5 C (97.7 F)   Resp 16   Ht 1.702 m (5\' 7" )   Wt 60.4 kg (133 lb 2.5 oz)   SpO2 99%   BMI 20.86 kg/m     NAD, resting comfortably in bed  Breathing non-labored    MOTOR   D B WE T FF HI  Right  5 5 5 5  * *  Left 5 5 5 5  * *      SENSORY   C5 C6 C7 C8 T1  Right 2 2 2 2 2    Left 2 2 2 2 2      *Refused to perform due to arthritic pain.   Refused to participate with LE exam/     PERTINENT LABS:   CBC:   Recent Labs     02/09/23  0551   WBC 14.2*   HGB 9.1*   HCT 28.2*   PLTCNT 180     ASSESSMENT:  62 y.o. male 1 Day Post-Op s/p above mentioned procedure.     PLAN:  - Weightbearing: WBAT  - PT/OT: ordered; recs pending  - DVT prophylaxis: SCDs; ambulate  - Antibiotics: 24h ancef  - Pain: po, IV  - Drain: in place, monitor output, pull when output is under 30cc for 1 shift  - Dressing: located posterior neck, okay for nursing to reinforce prn  - Diet: regular  - Dispo: likely d/c to home today  - Follow-up: will see back in Dr. Victorino Sparrow clinic in 2 weeks. Order placed in [place].    --    Jyl Heinz, MD  Orthopaedic Surgery, PGY-4  [facility]   Pager [phone]

## 2023-02-23 ENCOUNTER — Encounter (INDEPENDENT_AMBULATORY_CARE_PROVIDER_SITE_OTHER): Payer: Self-pay | Admitting: ORTHOPEDIC SURGERY

## 2023-02-23 ENCOUNTER — Ambulatory Visit: Payer: Medicare Other | Attending: ORTHOPEDIC SURGERY | Admitting: ORTHOPEDIC SURGERY

## 2023-02-23 ENCOUNTER — Other Ambulatory Visit: Payer: Self-pay

## 2023-02-23 DIAGNOSIS — G959 Disease of spinal cord, unspecified: Secondary | ICD-10-CM | POA: Insufficient documentation

## 2023-02-23 NOTE — Progress Notes (Signed)
This documentation is for a routine follow up visit, 2 weeks out from a C3-T1 posterior instrumented fusion with a posterior decompression.  At this time, the patient describes that he noticed a small improvement with his gait.  He did not have any significant neck pain, but is taking narcotics.  He has been able to taper down his narcotics.  On exam today he is intact strength and sensation in his bilateral upper extremities.  His incision appears well.  We obtained no x-rays today.  Staples were removed without issue.  Will continue collar wear for another 4 weeks.  I will see him back in 4 weeks for his 6 week follow up visit.

## 2023-02-26 ENCOUNTER — Other Ambulatory Visit (INDEPENDENT_AMBULATORY_CARE_PROVIDER_SITE_OTHER): Payer: Self-pay | Admitting: Nurse Practitioner

## 2023-02-26 ENCOUNTER — Telehealth (INDEPENDENT_AMBULATORY_CARE_PROVIDER_SITE_OTHER): Payer: Self-pay | Admitting: Nurse Practitioner

## 2023-02-26 MED ORDER — HYDROCODONE 5 MG-ACETAMINOPHEN 325 MG TABLET
1.0000 | ORAL_TABLET | Freq: Four times a day (QID) | ORAL | 0 refills | Status: AC | PRN
Start: 2023-02-26 — End: 2023-03-01

## 2023-02-26 NOTE — Telephone Encounter (Signed)
Pt called running out of post op pain meds, PDMP reviewed via Epic and no concerning red flags given post-op. Rx sent for #24 Norco. Pt aware of risks, benefits, safest use, alternatives and complimentary therapies.

## 2023-03-23 ENCOUNTER — Ambulatory Visit (INDEPENDENT_AMBULATORY_CARE_PROVIDER_SITE_OTHER): Payer: Self-pay | Admitting: ORTHOPEDIC SURGERY

## 2023-03-24 ENCOUNTER — Encounter (INDEPENDENT_AMBULATORY_CARE_PROVIDER_SITE_OTHER): Payer: Self-pay | Admitting: ORTHOPEDIC SURGERY

## 2023-03-24 ENCOUNTER — Ambulatory Visit: Payer: Medicare Other | Attending: ORTHOPEDIC SURGERY | Admitting: ORTHOPEDIC SURGERY

## 2023-03-24 ENCOUNTER — Other Ambulatory Visit: Payer: Self-pay

## 2023-03-24 ENCOUNTER — Other Ambulatory Visit (HOSPITAL_BASED_OUTPATIENT_CLINIC_OR_DEPARTMENT_OTHER): Payer: Medicare Other

## 2023-03-24 DIAGNOSIS — G959 Disease of spinal cord, unspecified: Secondary | ICD-10-CM

## 2023-03-24 NOTE — Progress Notes (Signed)
PATIENT NAME: Caleb Knight, Caleb Knight   HOSPITAL NUMBER:  [id number]  DATE OF SERVICE: 03/24/2023  DATE OF BIRTH:  07-28-1961    PROGRESS NOTE    SUBJECTIVE:  The patient is now 6 weeks from C3-T1 posterior instrument fusion and decompression. The patient notes she is doing really well. He notes his pain is a 0 out of 10.  He occasionally gets a tiny bit of pain behind his left ear, but other than this he is doing really well.  He feels like the gabapentin helps this and he would like to keep taking it.  He feels like his hands are less numb and tingly.  He has decreased dexterity issues.  He has not dropped anything now in about a month and a half.  He notes he is walking a little better.  He has self cleared himself of his collar about a week ago as he had to chase some cows.    IMAGING:  Reviewed x-ray cervical AP and lateral and shows well-aligned hardware and postsurgical changes.    OBJECTIVE:  The patient is well-appearing.  He stands briskly and briskly moves across the room with a much more narrow gait than he had prior.  He is still very unsteady on a tandem gait but can actually complete it.  Sensation is equal bilaterally in the hands but probably 1/2 when considered against more proximal sensation.  Hand intrinsics are 4+/5 on the right, otherwise, he is 5/5 through the cervical myotomes.  He has a negative Hoffmann's bilaterally.  The incision on the posterior neck is well healed at this time.    ASSESSMENT AND PLAN:  The patient is doing very well 6 weeks postop.  He self cleared of the collar a little early, but he seems to be doing well.  A little bit of axial decreased range of motion, which we would expect given his C3-T1 fusion.  He is having improvement with decreased numbness and tingling in the hands, improved dexterity, he is not dropping things.  He does seem to be more balanced.  He notes he is having to use to his neck being stiff and not being able to look down as much, but he seems to really be  doing quite well and we are happy for him.  We will see him back at the clinic for a 6 week visit, at that time, get cervical AP and lateral.     The patient was seen in conjunction with attending.        Josh Long, FNP-C    I independently met with and examined the patient. I have reviewed the above documentation. I agree with the exam and plan of care as documented.    Elsie Saas, MD                DD:  03/24/2023 11:42:07  DT:  03/24/2023 18:08:12 RO  D#:  [id number]

## 2023-03-25 DIAGNOSIS — G959 Disease of spinal cord, unspecified: Secondary | ICD-10-CM

## 2023-03-25 DIAGNOSIS — M4322 Fusion of spine, cervical region: Secondary | ICD-10-CM

## 2023-03-25 DIAGNOSIS — M961 Postlaminectomy syndrome, not elsewhere classified: Secondary | ICD-10-CM

## 2023-05-05 ENCOUNTER — Ambulatory Visit (INDEPENDENT_AMBULATORY_CARE_PROVIDER_SITE_OTHER): Payer: Medicare Other | Admitting: ORTHOPEDIC SURGERY

## 2023-06-01 ENCOUNTER — Ambulatory Visit (INDEPENDENT_AMBULATORY_CARE_PROVIDER_SITE_OTHER): Payer: Medicare Other | Admitting: ORTHOPEDIC SURGERY

## 2023-06-02 ENCOUNTER — Other Ambulatory Visit (HOSPITAL_BASED_OUTPATIENT_CLINIC_OR_DEPARTMENT_OTHER): Payer: Medicare Other

## 2023-06-02 ENCOUNTER — Other Ambulatory Visit (INDEPENDENT_AMBULATORY_CARE_PROVIDER_SITE_OTHER): Payer: Medicare Other

## 2023-06-02 ENCOUNTER — Other Ambulatory Visit: Payer: Self-pay

## 2023-06-02 ENCOUNTER — Encounter (INDEPENDENT_AMBULATORY_CARE_PROVIDER_SITE_OTHER): Payer: Self-pay | Admitting: ORTHOPEDIC SURGERY

## 2023-06-02 ENCOUNTER — Ambulatory Visit: Payer: Medicare Other | Attending: ORTHOPEDIC SURGERY | Admitting: ORTHOPEDIC SURGERY

## 2023-06-02 DIAGNOSIS — Z4889 Encounter for other specified surgical aftercare: Secondary | ICD-10-CM

## 2023-06-02 DIAGNOSIS — G959 Disease of spinal cord, unspecified: Secondary | ICD-10-CM

## 2023-06-02 DIAGNOSIS — R52 Pain, unspecified: Secondary | ICD-10-CM

## 2023-06-03 DIAGNOSIS — M5001 Cervical disc disorder with myelopathy,  high cervical region: Secondary | ICD-10-CM

## 2023-06-03 DIAGNOSIS — Z981 Arthrodesis status: Secondary | ICD-10-CM

## 2023-06-03 DIAGNOSIS — Z9889 Other specified postprocedural states: Secondary | ICD-10-CM

## 2023-06-03 NOTE — Progress Notes (Signed)
PATIENT NAME: Caleb Knight, Caleb Knight   HOSPITAL NUMBER:  [id number]  DATE OF SERVICE: 06/02/2023  DATE OF BIRTH:  12/06/1960    PROGRESS NOTE    DATE OF PROCEDURE:  February 08, 2023.    PROCEDURE:  C3 to T1 posterior instrumented fusion and decompression for cervical myelopathy.    SUBJECTIVE:  The patient is now over 3 months out from date of surgery.  Overall, the patient is doing well.  He relays that his neck pain is improved.  He is no longer experiencing numbness in his hands while driving.  The pain located behind left ear is also improved.  He is no longer dropping objects.  He does note decreased strength in his hands which he attributes to osteoarthritis.  Per his wife, his balance is also improved.  The patient denies any urinary symptoms.  They also have concerns regarding a palpable knot at the base of his incision.  He notes some increased pain when he reaches out with his left arm, but again symptoms are improved.  Overall, he is pleased with current relief.    OBJECTIVE:  The patient is sitting comfortably in chair in no acute distress.  He has a normal gait.  He is not using an assistive device.  Full strength in cervical and lumbar myotomes 5/5, except difficult to fully assess on the right hand intrinsics and finger flexion secondary to fourth and fifth trigger fingers.  Unable to appreciate hyperreflexia.  Negative Hoffmann's.  Negative clonus.  Incision inspected and well healed.  The palpable knot the patient is feeling is actually his spinous process.  Nontender to palpation.    IMAGING:  Cervical AP/lat completed today.  The patient maintains alignment.  Unable to appreciate hardware failure.    ASSESSMENT AND PLAN:  A 62 year old male who presents today for his 47-month followup for above-mentioned surgery.  Overall, the patient is doing well and is reporting improvement in his hand function and hand symptoms.  He is no longer reporting neck pain.  Overall, the patient is very pleased with his current  improvement.  His x-rays look great today.  We would like to follow up with the patient in 3 months with plans to repeat imaging at that time.  He has no restrictions moving forward from a cervical standpoint.  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  He does have trigger fingers of fourth and fifth digits on the right.  We will refer to Ortho Hand if this is problematic for the patient.  All questions were answered.  The patient is to followup sooner should he develop any new acute concerns.    I saw the patient conjunction with cosigning physician.        Silva Bandy, PA-C    I independently met with and examined the patient. I have reviewed the above documentation. I agree with the exam and plan of care as documented.    Elsie Saas, MD                DD:  06/02/2023 14:42:13  DT:  06/03/2023 07:00:28 CB  D#:  [id number]

## 2023-09-03 ENCOUNTER — Ambulatory Visit (INDEPENDENT_AMBULATORY_CARE_PROVIDER_SITE_OTHER): Payer: Medicare Other | Admitting: ORTHOPEDIC SURGERY

## 2023-09-21 ENCOUNTER — Ambulatory Visit: Payer: Medicare Other | Attending: ORTHOPEDIC SURGERY | Admitting: ORTHOPEDIC SURGERY

## 2023-09-21 ENCOUNTER — Other Ambulatory Visit: Payer: Self-pay

## 2023-09-21 ENCOUNTER — Encounter (INDEPENDENT_AMBULATORY_CARE_PROVIDER_SITE_OTHER): Payer: Self-pay | Admitting: ORTHOPEDIC SURGERY

## 2023-09-21 ENCOUNTER — Other Ambulatory Visit (INDEPENDENT_AMBULATORY_CARE_PROVIDER_SITE_OTHER): Payer: Medicare Other

## 2023-09-21 DIAGNOSIS — G959 Disease of spinal cord, unspecified: Secondary | ICD-10-CM

## 2023-09-21 DIAGNOSIS — M19041 Primary osteoarthritis, right hand: Secondary | ICD-10-CM

## 2023-09-21 DIAGNOSIS — M79641 Pain in right hand: Secondary | ICD-10-CM | POA: Insufficient documentation

## 2023-09-21 DIAGNOSIS — Z981 Arthrodesis status: Secondary | ICD-10-CM

## 2023-09-21 NOTE — Patient Instructions (Signed)
CERVICAL ISOMETRIC STRENGTHENING EXERCISES   For recovery after Cervical Spine Surgery  -------------------------------------------------------------------------------------------------------  Orthopaedic Spine Surgery  [facility]  -------------------------------------------------------------------------------------------------------  These exercises can be done while sitting against a wall, standing against a wall, or lying on your back on a firm surface.      FOR POSTERIOR CERVICAL SURGERIES - focus on EXTENSION strength (first two exercises).    CERVICAL ISOMETRIC EXTENSION     - Sit or stand with shoulders and head contacting the wall.  - Push the wall with the back of your head, while keeping your shoulders contacting the wall.  - Your chin *should tuck* during this maneuver, and not raise up.  - Your head and neck will not move much (isometric) but you will feel the back of your neck and upper back tense up.  - Hold the tension for 5-10 seconds then relax.  - Repeat 10 times.  Do this 3 times daily.         SCAPULAR WALL SLIDE  (This one may take some days, up to a week, to build up to)    - Sit up against wall using chair without a back so your back, shoulders, head can all contact the wall.  - Start with arms in a W shape with elbows and backs of hands contacting the wall.  - Push arms upwards slowly, for a slow count to 5 or 10 seconds, while keeping elbows and hands contacting the wall, until hands are overhead.  - Lower arms back down slowly for a count to 5, to starting W shape.  - Repeat 10 times.  Do this 3 times daily.         Other exercises to try (but focus on the above, most importantly)    CERVICAL SIDE BENDING     - Keep your head straight and your chin level. Put your right hand on the right side of your head.   - Try to bring your head down to your right shoulder while pushing up with your right hand.   - REPEAT the Side Bending, but to the left side with your left  hand.         CERVICAL ROTATION     - Put your right hand on the right side of your face.  - Turn your head to the right while pushing it back with your right hand.   - REPEAT the Rotation Exercise, but on the left side of your face and with left hand.         CERVICAL FLEXION     - Bend your neck slightly forward and put your hand on your forehead.   - Try to bend your head forward while pushing back with your hand.       If any concerns or pain with these exercises - stop immediately and contact us.

## 2023-09-22 NOTE — Progress Notes (Signed)
PATIENT NAME: Caleb Knight, Caleb Knight   HOSPITAL NUMBER:  [id number]  DATE OF SERVICE: 09/21/2023  DATE OF BIRTH:  1961/05/26    PROGRESS NOTE    DATE OF SURGERY:  February 08, 2023, C3-T1 PSF for cervical myelopathy.    SUBJECTIVE:  The patient notes he is doing fairly well other than he does get a lot of pain in somewhat trap scap region in the musculature.  It is worse after a long day of activity.  It feels like he has changed the way he has to move his neck and shoulders since the surgery and that is what is causing the pain.  He does occasionally get a little bit of numbness and tingling into the left thumb and index finger. He occasionally take half a muscle relaxer at night and this seems to help. He is also affected by the fact he has somewhat limited range of motion of the shoulder.  He does have somewhat severe arthritis in the right hand that affects his strength and range of motion.  He would be interested in seeing Hand to discuss this further. As far as his hand function he feels like it is good other than the arthritis.  His dexterity has improved.  He feels like his balance is better.  He notes that he shingled a roof recently and did not feel concerned about it. Urinary function remains normal.  He is very content with the results of surgery.    OBJECTIVE:  Objectively, the patient is well appearing.  He does have limited flexion of the left shoulder, probably to about 100 degrees active and passive, I can probably get him to 125 nonpainful.  There is marked arthritis of bilateral hands.  There is decreased flexion of the index and middle fingers with a fairly hard endpoint.  Cervical myotomes are 5 out of 5 throughout.  Dermatomes are 2 out of 2.  Range of motion of the neck, axially and in flexion and extension limited as expected.    IMAGING:  Cervical AP and lateral shows well-aligned hardware without any concerning change.    ASSESSMENT AND PLAN:  We did give the patient a home exercise program.  He would  rather do this than PT.  We could also consider doing Botox injections or trigger point injections in the future if he is persistently having pain and wants to try other treatments.    We will see the patient back in 6 months.  At that visit, get x-ray cervical AP and lateral.    The patient was seen in conjunction with the attending.        Josh Long, FNP-C    I independently met with and examined the patient. I have reviewed the above documentation. I agree with the exam and plan of care as documented.    Elsie Saas, MD                DD:  09/21/2023 12:26:00  DT:  09/22/2023 11:08:08 BW  D#:  [id number]

## 2023-10-18 ENCOUNTER — Ambulatory Visit (HOSPITAL_BASED_OUTPATIENT_CLINIC_OR_DEPARTMENT_OTHER): Payer: Medicare Other | Admitting: Hand Surgery

## 2023-11-10 HISTORY — PX: HX CERVICAL FUSION: SHX112

## 2023-11-15 ENCOUNTER — Ambulatory Visit (HOSPITAL_BASED_OUTPATIENT_CLINIC_OR_DEPARTMENT_OTHER): Payer: Medicare Other | Admitting: Hand Surgery

## 2023-11-29 ENCOUNTER — Ambulatory Visit (INDEPENDENT_AMBULATORY_CARE_PROVIDER_SITE_OTHER): Payer: Self-pay | Admitting: Podiatrist

## 2023-12-22 ENCOUNTER — Other Ambulatory Visit (HOSPITAL_BASED_OUTPATIENT_CLINIC_OR_DEPARTMENT_OTHER): Payer: Self-pay | Admitting: Student in an Organized Health Care Education/Training Program

## 2024-01-06 ENCOUNTER — Ambulatory Visit (HOSPITAL_BASED_OUTPATIENT_CLINIC_OR_DEPARTMENT_OTHER): Payer: Medicare Other | Admitting: Hand Surgery

## 2024-01-10 ENCOUNTER — Ambulatory Visit (INDEPENDENT_AMBULATORY_CARE_PROVIDER_SITE_OTHER): Payer: Self-pay | Admitting: Podiatrist

## 2024-01-18 ENCOUNTER — Telehealth (INDEPENDENT_AMBULATORY_CARE_PROVIDER_SITE_OTHER): Payer: Self-pay | Admitting: ORTHOPEDIC SURGERY

## 2024-01-18 NOTE — Nursing Note (Signed)
 Message  Received: Today  Derenda Fennel, APRN,NP-C  Durenda Hurt, RN  Can you let him know Dr Jake Seats states that the isometric exercises that are on his AVS are sufficient please?          Previous Messages    Message from Leodis Sias sent at 01/14/2024  1:21 PM EST    Dr. Jake Seats patient-    Patient's wife states patient was given a sheet of exercises that he could do and he has misplaced it. He saw a few listed on his MyChart, but they are not the ones he is needing.  She is asking that they be placed in his MyChart.  Thank you.       Call History    Contact Date/Time Type Contact Phone/Fax By   01/14/2024 01:19 PM EST Phone (Incoming) [place] (Spouse) [phone] Fredderick Erb   01/14/2024 01:18 PM EST Phone (Incoming) Massie Bougie (Spouse) [phone] Fredderick Erb       Called and spoke with the patient's wife who did print off the AVS for him but insist there are more exercises. Obtained spine conditioning program as per Dr Jake Seats and mailed this to his home address.  Durenda Hurt, RN

## 2024-02-14 ENCOUNTER — Ambulatory Visit (HOSPITAL_BASED_OUTPATIENT_CLINIC_OR_DEPARTMENT_OTHER): Payer: Medicare Other | Admitting: Hand Surgery

## 2024-03-21 ENCOUNTER — Ambulatory Visit (INDEPENDENT_AMBULATORY_CARE_PROVIDER_SITE_OTHER): Payer: Self-pay | Admitting: ORTHOPEDIC SURGERY

## 2024-03-28 ENCOUNTER — Ambulatory Visit (INDEPENDENT_AMBULATORY_CARE_PROVIDER_SITE_OTHER): Payer: Self-pay | Admitting: Adult Health

## 2024-05-02 ENCOUNTER — Ambulatory Visit (INDEPENDENT_AMBULATORY_CARE_PROVIDER_SITE_OTHER): Payer: Self-pay | Admitting: ORTHOPEDIC SURGERY

## 2024-06-14 ENCOUNTER — Other Ambulatory Visit (HOSPITAL_BASED_OUTPATIENT_CLINIC_OR_DEPARTMENT_OTHER): Payer: Self-pay

## 2024-06-14 NOTE — Telephone Encounter (Signed)
 Patient LOV in 2023, sent scheduling a message to schedule an appointment to be able to continue to get refills. Patient called in asking for a refill.   Harlene Montenegro, CCMA

## 2024-06-15 MED ORDER — ATENOLOL 25 MG TABLET
12.5000 mg | ORAL_TABLET | Freq: Every day | ORAL | 0 refills | Status: DC
Start: 2024-06-15 — End: 2024-09-08

## 2024-06-26 ENCOUNTER — Other Ambulatory Visit: Payer: Self-pay

## 2024-06-26 ENCOUNTER — Ambulatory Visit: Payer: Self-pay

## 2024-06-26 ENCOUNTER — Encounter (HOSPITAL_BASED_OUTPATIENT_CLINIC_OR_DEPARTMENT_OTHER): Payer: Self-pay

## 2024-06-26 VITALS — BP 104/68 | HR 59 | Ht 65.0 in | Wt 134.3 lb

## 2024-06-26 DIAGNOSIS — I341 Nonrheumatic mitral (valve) prolapse: Secondary | ICD-10-CM | POA: Insufficient documentation

## 2024-06-26 DIAGNOSIS — R001 Bradycardia, unspecified: Secondary | ICD-10-CM | POA: Insufficient documentation

## 2024-06-26 DIAGNOSIS — Z8249 Family history of ischemic heart disease and other diseases of the circulatory system: Secondary | ICD-10-CM

## 2024-06-26 DIAGNOSIS — Z87891 Personal history of nicotine dependence: Secondary | ICD-10-CM

## 2024-06-26 DIAGNOSIS — E785 Hyperlipidemia, unspecified: Secondary | ICD-10-CM | POA: Insufficient documentation

## 2024-06-26 DIAGNOSIS — I10 Essential (primary) hypertension: Secondary | ICD-10-CM | POA: Insufficient documentation

## 2024-06-26 LAB — ECG 12 LEAD W/ INTERP (AMB USE ONLY) (MUSE, IN CLINC) (93005/93010)
Atrial Rate: 55 {beats}/min
Calculated P Axis: 72 degrees
Calculated R Axis: 79 degrees
Calculated T Axis: 72 degrees
PR Interval: 166 ms
QRS Duration: 80 ms
QT Interval: 410 ms
QTC Calculation: 392 ms
Ventricular rate: 55 {beats}/min

## 2024-06-26 NOTE — Patient Instructions (Addendum)
 Continue same medications    I will get blood work records from PCP     Echocardiogram - Office will contact you to schedule     Goal BP 100/60 to 130/80 - Contact the office if consistently below 100/60     Goal heart rate 60 to 100 - Contact the office if consistently below 50    Check HR and BP if feeling tired, light headed or dizzy     Increase water  intake     Call or go to the ED for chest pain, shortness of breath, or palpitations

## 2024-06-26 NOTE — Progress Notes (Signed)
 Caleb Knight  [address]  [place] [address]  [id number]    Date:   06/26/2024  Name: Caleb Knight  Age: 63 y.o.    REASON FOR VISIT:   Follow Up    CARDIAC HISTORY:     Caleb Knight is a 63 y.o. male with medical history significant for the following who presents today for a routine follow-up appointment with cardiac history murmur, hypertension, hyperlipidemia, mitral prolapse.     He was last seen in the office in March, 2023.  He previously followed with Knight in [place] . Echo in 2021 which showed normal left ventricular systolic function, no wall motion abnormalities, borderline mitral valve prolapse with trace MR, borderline LVH.  He had a negative Lexiscan Cardiolite stress test in 2018.  His LDL was noted to be 170, he was offered statin therapy which he declined.       HISTORY OF PRESENTING ILLNESS:    The patient presents today with his wife reports that he is doing very well and active at home without limitations.  He takes care of a farm, a garden, and his lawn.  He does not routinely check blood pressure or heart rate at home, his wrist monitor is not working.  He has no new concerns at this time and reports that his PCP just did lab work. He denies any chest pain, palpitations, shortness of breath, dizziness, near-syncope, syncope, orthopnea, or PND.      Current Outpatient Medications   Medication Sig    amitriptyline (ELAVIL) 50 mg Oral Tablet Take 1 Tablet (50 mg total) by mouth Every night    atenoloL  (TENORMIN ) 25 mg Oral Tablet Take 0.5 Tablets (12.5 mg total) by mouth Daily Takes in evening    diazePAM  (VALIUM ) 5 mg Oral Tablet 2 hours prior to procedure take 1 tab. If still having severe anxiety prior to procedure may take an additional 1/2 to 1 tab. Do not drive. (Patient not taking: Reported on 06/26/2024)    gabapentin (NEURONTIN) 100 mg Oral Capsule Take 1 Capsule (100 mg total) by mouth Three times a day  (Patient not taking: Reported on 06/26/2024)    hydrOXYzine HCL (ATARAX) 25 mg Oral Tablet Take 1 Tablet (25 mg total) by mouth Twice per day as needed for Anxiety    naloxone  (NARCAN ) 4 mg per spray nasal spray Instill 1 Spray by INTRANASAL route Every 2 minutes as needed for Other (actual or suspected opioid overdose). Call 911 if given.    omega-3-DHA-EPA-fish oil (FISH OIL) 1,000 mg (120 mg-180 mg) Oral Capsule Take 1 Capsule by mouth Once a day    sertraline (ZOLOFT) 50 mg Oral Tablet Take 1 Tablet (50 mg total) by mouth Daily    traZODone (DESYREL) 100 mg Oral Tablet Take 1 Tablet (100 mg total) by mouth Every night     Allergies[1]  Past Medical History:   Diagnosis Date    Dysrhythmias     Afib    Essential hypertension     controlled with medication    Mitral valve prolapse     Neck pain     Osteoarthritis          Past Surgical History:   Procedure Laterality Date    HX BACK SURGERY      Lumbar         Family Medical History:       Problem Relation (Age of Onset)    COPD Brother  Cancer Brother    Diabetes Brother, Daughter    Elevated Lipids Daughter    Hearing Loss Mother    Heart Attack Father    Heart Disease Mother, Father, Sister    Hypertension (High Blood Pressure) Mother, Father            Social History     Socioeconomic History    Marital status: Married   Occupational History    Occupation: disabled, Psychologist, occupational   Tobacco Use    Smoking status: Never    Smokeless tobacco: Former     Types: Chew    Tobacco comments:     Quit 10-15 years ago, used for about 20 years    Vaping Use    Vaping status: Never Used   Substance and Sexual Activity    Alcohol use: Yes     Comment: occasional    Drug use: Not Currently   Other Topics Concern    Routine Exercise Yes     Comment: daily farm work    Ability to [employer] 2 Flight of Steps without SOB/CP Yes    Ability To Do Own ADL's Yes    Uses Walker No    Uses Cane No     Social Determinants of Health      Received from [employer]    Social Network    Received  from [facility]    HITS       REVIEW OF SYSTEMS:  Constitutional: No fever.   Respiratory: See HPI.  Cardiovascular: See HPI.  All other systems reviewed and are negative except as noted in HOPI.    PHYSICAL EXAMINATION:     Vitals reviewed. BP 104/68 (Site: Left Arm, Patient Position: Sitting)   Pulse 59   Ht 1.651 m (5' 5)   Wt 60.9 kg (134 lb 4.8 oz)   SpO2 98%   BMI 22.35 kg/m       Vitals Filed         06/26/2024  1057 06/26/2024  1059          BP: 104/66 104/68      Pulse: 59 --              Constitutional: He is oriented to person, place, and time.   Cardiovascular: Normal rate and rhythm without murmurs.  No JVD.  No peripheral edema is noted.  Pulmonary/Chest: Lungs clear.    Abdominal: Soft. Nontender, non-distended, no organomegaly.    Labs:   No results found for: HA1C, MICALBCREAT, LDLCHOL, TRIG, HDLCHOL, CHOLESTEROL   Lab Results   Component Value Date    SODIUM 137 02/09/2023    POTASSIUM 4.0 02/09/2023    CHLORIDE 107 02/09/2023    CO2 24 02/09/2023    BUN 12 02/09/2023    CREATININE 0.79 02/09/2023    GFR >90 02/09/2023    CALCIUM 8.3 (L) 02/09/2023     ASSESSMENT:    ENCOUNTER DIAGNOSES     ICD-10-CM   1. Mitral prolapse  I34.1   2. Bradycardia  R00.1   3. Hypertension, unspecified type  I10   4. Hyperlipidemia, unspecified hyperlipidemia type  E78.5     PLAN:    Caleb Knight denies recent or current anginal symptoms  He appears euvolemic on exam.  EKG in the office indicated sinus bradycardia, rate 55, QTC 392.  He does not routinely check heart rate at home.  He is asymptomatic.  Normotensive on exam.  Does not routinely check blood pressure at home.  He was provided with a monitor and advised to routinely check blood pressure and heart rate at home.  Goal heart rate 60-100, goal blood pressure greater than 100/60, but less than 130/80.  He was advised to check heart rate and BP if feeling tired, lightheaded, or dizzy  Echo in 2021 which showed normal left ventricular systolic  function, no wall motion abnormalities, borderline mitral valve prolapse with trace MR, borderline LVH.  Order for repeat echocardiogram placed to assess current EF and valvular structures.   Was advised to go to the ER for chest pain, shortness of breath, palpitations  We will obtain recent lipid panel from PCP.  Patient is currently taking fish oil.    Orders Placed This Encounter    CANCELED: LIPID PANEL    EKG (today in clinic)    TRANSTHORACIC ECHOCARDIOGRAM - ADULT COMPLETE       Return in about 6 months (around 12/27/2024) for Dr. Arvin ONLY . for continued management of chronic health issues including mitral prolapse, bradycardia, hypertension, hyperlipidemia.    Electronically signed by     Sonny Moats, APRN 06/26/2024, 12:40  I was not present during patient visit but was available for any patient care recommendations.    Schuyler Hammers, MD  06/26/2024, 15:54    Any information populated during pre-charting was verified with the patient at the time of the appointment.       This note may have been partially generated using MModal Fluency Direct system, and there may be some incorrect words, spellings, and punctuation that were not noted in checking the note before saving, though effort was made to avoid such errors.         [1]   Allergies  Allergen Reactions    Latex, Natural Rubber

## 2024-06-27 ENCOUNTER — Ambulatory Visit (HOSPITAL_BASED_OUTPATIENT_CLINIC_OR_DEPARTMENT_OTHER): Payer: Self-pay

## 2024-06-27 NOTE — Telephone Encounter (Signed)
 Faxed request for lab results to PCP per Sonny Moats NP via [employer]. Jeoffrey Lowe, [place]

## 2024-07-05 ENCOUNTER — Ambulatory Visit (INDEPENDENT_AMBULATORY_CARE_PROVIDER_SITE_OTHER): Payer: Self-pay | Admitting: ORTHOPEDIC SURGERY

## 2024-07-11 ENCOUNTER — Other Ambulatory Visit: Payer: Self-pay

## 2024-07-11 ENCOUNTER — Ambulatory Visit (INDEPENDENT_AMBULATORY_CARE_PROVIDER_SITE_OTHER)

## 2024-07-11 ENCOUNTER — Encounter (INDEPENDENT_AMBULATORY_CARE_PROVIDER_SITE_OTHER): Payer: Self-pay | Admitting: ORTHOPEDIC SURGERY

## 2024-07-11 ENCOUNTER — Ambulatory Visit: Payer: Self-pay | Attending: ORTHOPEDIC SURGERY | Admitting: ORTHOPEDIC SURGERY

## 2024-07-11 DIAGNOSIS — M62838 Other muscle spasm: Secondary | ICD-10-CM | POA: Insufficient documentation

## 2024-07-11 DIAGNOSIS — G959 Disease of spinal cord, unspecified: Secondary | ICD-10-CM | POA: Insufficient documentation

## 2024-07-11 DIAGNOSIS — R202 Paresthesia of skin: Secondary | ICD-10-CM

## 2024-07-11 DIAGNOSIS — Z981 Arthrodesis status: Secondary | ICD-10-CM | POA: Insufficient documentation

## 2024-07-11 DIAGNOSIS — M7918 Myalgia, other site: Secondary | ICD-10-CM | POA: Insufficient documentation

## 2024-07-11 MED ORDER — SCOPOLAMINE 1 MG OVER 3 DAYS TRANSDERMAL PATCH
1.0000 | MEDICATED_PATCH | TRANSDERMAL | 0 refills | Status: AC
Start: 2024-07-11 — End: 2024-07-12

## 2024-07-11 NOTE — Progress Notes (Signed)
 [facility]  Department of Orthopaedics    DATE OF SERVICE: 07/11/2024     DATE OF SURGERY: 02/08/23    PROCEDURE: C3-7T1 PSF for cervical myelopathy    Mr. Caleb Knight presents for a follow up who is approximately 1.5 years s/p above procedure.  He does endorse intermittent numbness to his hands particularly with driving.  He denies any dexterity issue. He denies any urinary issues  he denies any issues with balance.  Today his main issue is bilateral trap pain.  He reports hard and tight spots to his traps.  He has tried a home exercise program and having his wife massage the area.  He has no durable relief with these.  Formal physical therapy and PPIs potentially with Botox were discussed previously.  He declined these due to the drive.  He is willing to consider these today.    Motor strength is 5/5. Sensation to light touch is intact. Incision is well healed.  No erythema or drainage.  He is very guarded with his movements today.  He turns his whole body rather than his neck.  On palpation his traps are very hard and tight.    Imaging studies:  X-rays reveal intact hardware without any lucency.  His overall alignment is similar to his preoperative imaging.    I reviewed the above findings with the patient. Overall he is doing well aside from bilateral trapezius pain.  We reviewed his x-rays with him.  We discussed that this does not look like there is anything dangerous going on.  It appears that it is a myofascial issue.  We discussed that we would recommend considering trigger points with Botox.  We will order these today.  After he has had these done we recommend physical therapy and medical massage.  The patient states that he has a lot of difficulty with nausea when driving up here.  He has tried Dramamine without success.  We will send in a scopolamine  patch for him to try to make the trip up here.  Encouraged to ambulate as tolerated. Follow up after TPIs or sooner if he has any problems. He is in  agreement with the plan and all his questions were answered.    I have seen this patient in conjunction  with the cosigning physician present.  Shirleyann Montero Stimac, APRN,NP-C 07/11/2024, 15:38  Department of Orthopaedics      I independently met with and examined the patient. I have reviewed the above documentation. I agree with the exam and plan of care as documented.    Thom Bobo, MD

## 2024-07-13 DIAGNOSIS — G959 Disease of spinal cord, unspecified: Secondary | ICD-10-CM

## 2024-07-17 ENCOUNTER — Other Ambulatory Visit (HOSPITAL_COMMUNITY): Payer: Self-pay | Admitting: Family

## 2024-07-17 DIAGNOSIS — M1990 Unspecified osteoarthritis, unspecified site: Secondary | ICD-10-CM

## 2024-08-03 ENCOUNTER — Ambulatory Visit (HOSPITAL_BASED_OUTPATIENT_CLINIC_OR_DEPARTMENT_OTHER): Payer: Self-pay

## 2024-08-03 ENCOUNTER — Telehealth (INDEPENDENT_AMBULATORY_CARE_PROVIDER_SITE_OTHER): Payer: Self-pay | Admitting: Anesthesiology

## 2024-08-03 NOTE — Telephone Encounter (Signed)
 CM contacted PT and went over Non Fasting guidelines for upcoming procedure on 10.09.25 with Dr Gareld. PT states they are not currently taking antibiotics. Pt was instructed to inform the [facility] ([facility]) if they start any antibiotics. Between their guideline review date and  up to and including their appt date to notify staff ASAP. Because their appt may be canceled or rescheduled. PT had no further questions at this time Clinic phone number was provided to PT at this time.    08/03/2024  Caleb Knight, CASE MANAGER

## 2024-08-16 ENCOUNTER — Other Ambulatory Visit: Payer: Self-pay

## 2024-08-17 ENCOUNTER — Ambulatory Visit: Payer: Self-pay | Attending: Anesthesiology | Admitting: Anesthesiology

## 2024-08-17 DIAGNOSIS — G959 Disease of spinal cord, unspecified: Secondary | ICD-10-CM | POA: Insufficient documentation

## 2024-08-17 DIAGNOSIS — M62838 Other muscle spasm: Secondary | ICD-10-CM | POA: Insufficient documentation

## 2024-08-17 DIAGNOSIS — Z981 Arthrodesis status: Secondary | ICD-10-CM | POA: Insufficient documentation

## 2024-08-17 DIAGNOSIS — M7918 Myalgia, other site: Secondary | ICD-10-CM | POA: Insufficient documentation

## 2024-08-17 NOTE — Nursing Note (Signed)
[place] Pain Rating Scale     On a scale of 0-10, during the past 24 hours, pain has interfered with you usual activity: 8     On a scale of 0-10, during the past 24 hours, pain has interfered with your sleep: 8    On a scale of 0-10, during the past 24 hours, pain has affected your mood: 8     On a scale of 0-10, during the past 24 hours, pain has contributed to your stress: 7     On a scale of 0-10, what is your overall pain Rating: 6

## 2024-08-17 NOTE — Patient Instructions (Signed)
 [facility]  [address]  [place] [address]  Dept: [phone]  Dept Fax: [phone]  [phone]                                                 SPECIAL PROCEDURES                                     DISCHARGE FORM                                          [phone]      Please follow the instructions listed below for your procedures.  If you have questions concerning your procedure, you may call and leave a message.  Messages will be returned by the end of the next business day.  If you have an emergency, proceed to your local Emergency Department.      PROCEDURE: TPI    Do not drive a car or operate machinery until tomorrow.  Rest today and return to normal activities tomorrow.  If you are on restricted activities by your physician, please continue to follow these.  If you are not sure, contact your physician.  It is possible to experience mild numbness of the lower back and legs.  This is temporary.  If you have soreness at the injection site, the application of heat or ice may be helpful. Mild analgesics may also be used.  In case of severe headache; lie flat to decrease it.  Increase all fluids especially those with caffeine.  Mild analgesics are also appropriate.  If headache is not relieved by these measures, contact the Pain Clinic.  Steroid injections may cause temporary increase of blood sugar levels.    These instructions have been reviewed with the patient and appropriate questions have answers.  [place], [place] 08/17/2024 13:02

## 2024-08-17 NOTE — Procedures (Signed)
 [facility]  [address]  [facility] [place] [address]  Operated by [facility]  Procedure Note    Name: [name] MRN:  [id number]   Date: 08/17/2024 DOB:  1960/12/31 (63 y.o.)         20553 - INJ SINGLE OR MULTIPLE TRIGGER POINTS, 3 OR MORE MUSCLE (AMB ONLY)    Performed by: Gareld Ade, MD  Authorized by: Aretta Regan, APRN, CNP    Time Out:     Immediately before the procedure, a time out was called:  Yes    Patient verified:  Yes    Procedure Verified:  Yes    Site Verified:  Yes  Documentation:      Trigger point injections  [facility]  [address]  [place] [address]  [phone]      PATIENT NAME: Caleb Knight  CHART [id number]:[id number]  DATE OF BIRTH: 04/13/1961  DATE OF SERVICE:08/17/2024    SITE OF SERVICE  [facility]    Before the procedure was performed the patient was introduced to all the staff in the room and their duties or level of training  including providers.  If the patient had any questions they were answered completely at this time.        PREOPERATIVE DIAGNOSIS:     ICD-10-CM    1. Cervical myelopathy (CMS HCC)  G95.9 US  GUIDED INJECTION OF      2. Muscle spasm  M62.838 US  GUIDED INJECTION OF      3. History of fusion of cervical spine  Z98.1                POSTOPERATIVE DIAGNOSIS: Same      NAME OF PROCEDURE: Trigger Point Injection with Ultrasound    SURGEON: Ade Gareld, MD    ASSISTANT:Chike Nwebe MD        ANESTHESIA: None    ESTIMATED BLOOD LOSS: None    COMPLICATIONS:  were no complications    DESCRIPTION OF PROCEDURE:       After informed consent was obtained, patient was placed in a comfortable position. Areas of most tenderness were palpated and marked.  The skin was prepped  in the usual sterile fashion using alcohol.  At each of the previously marked locations ultrasound was used to identify a trigger point 100 units Botox  injected in divided doses diluted in saline after negative aspiration in a fanning manner in various planes. There was either or both a local taut response to snapping palpation or needle   insertion; and reproduction of referred pain pattern upon stimulation of the trigger point.   There were no complications or paresthesias.  Patient tolerated the procedure well and all needles were removed intact.    Ultrasound confirmed needle placement in the following  muscles:bilateral neck and suboccipital triangle     Patient was observed in recovery and discharged home with written instructions under supervision in stable condition.    Ade Gareld, MD 08/17/2024, 13:38  AssociateProfessor  Department of Anesthesiology,Psychiatry and Neurosciences  Director [facility]      Ade Gareld, MD

## 2024-08-25 ENCOUNTER — Other Ambulatory Visit: Payer: Self-pay

## 2024-08-25 ENCOUNTER — Ambulatory Visit
Admission: RE | Admit: 2024-08-25 | Discharge: 2024-08-25 | Disposition: A | Source: Ambulatory Visit | Attending: Interventional Cardiology

## 2024-08-25 DIAGNOSIS — I341 Nonrheumatic mitral (valve) prolapse: Secondary | ICD-10-CM | POA: Insufficient documentation

## 2024-08-28 DIAGNOSIS — I341 Nonrheumatic mitral (valve) prolapse: Secondary | ICD-10-CM

## 2024-08-28 LAB — TRANSTHORACIC ECHOCARDIOGRAM - ADULT
EF VISUAL ESTIMATE: 60
EF: 65

## 2024-08-29 ENCOUNTER — Ambulatory Visit (HOSPITAL_BASED_OUTPATIENT_CLINIC_OR_DEPARTMENT_OTHER): Payer: Self-pay

## 2024-08-29 ENCOUNTER — Encounter (INDEPENDENT_AMBULATORY_CARE_PROVIDER_SITE_OTHER): Payer: Self-pay | Admitting: Student in an Organized Health Care Education/Training Program

## 2024-09-01 ENCOUNTER — Ambulatory Visit (INDEPENDENT_AMBULATORY_CARE_PROVIDER_SITE_OTHER): Payer: Self-pay | Admitting: ORTHOPEDIC SURGERY

## 2024-09-04 ENCOUNTER — Other Ambulatory Visit (HOSPITAL_BASED_OUTPATIENT_CLINIC_OR_DEPARTMENT_OTHER): Payer: Self-pay | Admitting: Cardiovascular Disease

## 2024-09-29 ENCOUNTER — Encounter (INDEPENDENT_AMBULATORY_CARE_PROVIDER_SITE_OTHER): Payer: Self-pay

## 2024-09-29 ENCOUNTER — Ambulatory Visit (INDEPENDENT_AMBULATORY_CARE_PROVIDER_SITE_OTHER): Admitting: Student in an Organized Health Care Education/Training Program

## 2024-11-21 ENCOUNTER — Other Ambulatory Visit: Payer: Self-pay

## 2024-11-21 ENCOUNTER — Ambulatory Visit (HOSPITAL_COMMUNITY)

## 2024-11-21 ENCOUNTER — Other Ambulatory Visit (HOSPITAL_COMMUNITY): Payer: Self-pay | Admitting: Family

## 2024-11-21 ENCOUNTER — Ambulatory Visit
Admission: RE | Admit: 2024-11-21 | Discharge: 2024-11-21 | Disposition: A | Discharge status: Home / Self Care | Source: Ambulatory Visit | Attending: Family | Admitting: Family

## 2024-11-21 ENCOUNTER — Ambulatory Visit (HOSPITAL_COMMUNITY): Admission: RE | Admit: 2024-11-21 | Discharge: 2024-11-21 | Disposition: A | Source: Ambulatory Visit

## 2024-11-21 DIAGNOSIS — M25511 Pain in right shoulder: Secondary | ICD-10-CM | POA: Insufficient documentation

## 2024-11-21 DIAGNOSIS — M5412 Radiculopathy, cervical region: Secondary | ICD-10-CM | POA: Insufficient documentation

## 2024-12-05 ENCOUNTER — Ambulatory Visit (INDEPENDENT_AMBULATORY_CARE_PROVIDER_SITE_OTHER): Admitting: Student in an Organized Health Care Education/Training Program

## 2024-12-06 ENCOUNTER — Other Ambulatory Visit (HOSPITAL_BASED_OUTPATIENT_CLINIC_OR_DEPARTMENT_OTHER): Payer: Self-pay | Admitting: Interventional Cardiology

## 2024-12-07 ENCOUNTER — Ambulatory Visit (INDEPENDENT_AMBULATORY_CARE_PROVIDER_SITE_OTHER): Admitting: Student in an Organized Health Care Education/Training Program

## 2024-12-27 ENCOUNTER — Ambulatory Visit (HOSPITAL_BASED_OUTPATIENT_CLINIC_OR_DEPARTMENT_OTHER): Payer: Self-pay | Admitting: Interventional Cardiology

## 2025-01-16 ENCOUNTER — Ambulatory Visit (INDEPENDENT_AMBULATORY_CARE_PROVIDER_SITE_OTHER): Admitting: Student in an Organized Health Care Education/Training Program
